# Patient Record
Sex: Female | Born: 1960 | Race: Black or African American | Hispanic: No | Marital: Married
Health system: Western US, Academic
[De-identification: ages and names within clinical notes are randomized; demographics above are authoritative.]

## PROBLEM LIST (undated history)

## (undated) DIAGNOSIS — K8020 Calculus of gallbladder without cholecystitis without obstruction: Secondary | ICD-10-CM

## (undated) DIAGNOSIS — M069 Rheumatoid arthritis, unspecified: Secondary | ICD-10-CM

## (undated) DIAGNOSIS — E039 Hypothyroidism, unspecified: Secondary | ICD-10-CM

## (undated) HISTORY — PX: APPENDECTOMY: SHX54

## (undated) HISTORY — DX: Rheumatoid arthritis, unspecified: M06.9

---

## 2005-05-01 ENCOUNTER — Ambulatory Visit (HOSPITAL_COMMUNITY): Admission: RE | Admit: 2005-05-01 | Discharge: 2005-05-01 | Payer: Self-pay | Admitting: Family Medicine

## 2005-05-20 ENCOUNTER — Emergency Department (HOSPITAL_COMMUNITY): Admission: EM | Admit: 2005-05-20 | Discharge: 2005-05-20 | Payer: Self-pay | Admitting: *Deleted

## 2005-05-22 ENCOUNTER — Other Ambulatory Visit: Admission: RE | Admit: 2005-05-22 | Discharge: 2005-05-22 | Payer: Self-pay | Admitting: General Surgery

## 2010-02-21 ENCOUNTER — Ambulatory Visit (HOSPITAL_COMMUNITY): Admission: RE | Admit: 2010-02-21 | Discharge: 2010-02-21 | Payer: Self-pay | Admitting: Family Medicine

## 2010-11-26 ENCOUNTER — Encounter: Payer: Self-pay | Admitting: Family Medicine

## 2016-03-02 DIAGNOSIS — M19041 Primary osteoarthritis, right hand: Secondary | ICD-10-CM | POA: Diagnosis not present

## 2016-03-02 DIAGNOSIS — M79672 Pain in left foot: Secondary | ICD-10-CM | POA: Diagnosis not present

## 2016-03-02 DIAGNOSIS — M2550 Pain in unspecified joint: Secondary | ICD-10-CM | POA: Diagnosis not present

## 2016-03-02 DIAGNOSIS — R300 Dysuria: Secondary | ICD-10-CM | POA: Diagnosis not present

## 2016-03-02 DIAGNOSIS — M25562 Pain in left knee: Secondary | ICD-10-CM | POA: Diagnosis not present

## 2016-03-02 DIAGNOSIS — M25571 Pain in right ankle and joints of right foot: Secondary | ICD-10-CM | POA: Diagnosis not present

## 2016-03-02 DIAGNOSIS — M25561 Pain in right knee: Secondary | ICD-10-CM | POA: Diagnosis not present

## 2016-03-02 DIAGNOSIS — R5381 Other malaise: Secondary | ICD-10-CM | POA: Diagnosis not present

## 2016-03-02 DIAGNOSIS — Z113 Encounter for screening for infections with a predominantly sexual mode of transmission: Secondary | ICD-10-CM | POA: Diagnosis not present

## 2016-03-02 DIAGNOSIS — M19042 Primary osteoarthritis, left hand: Secondary | ICD-10-CM | POA: Diagnosis not present

## 2016-03-02 DIAGNOSIS — M79671 Pain in right foot: Secondary | ICD-10-CM | POA: Diagnosis not present

## 2016-03-05 ENCOUNTER — Other Ambulatory Visit (HOSPITAL_COMMUNITY): Payer: Self-pay | Admitting: Registered Nurse

## 2016-03-05 DIAGNOSIS — Z1231 Encounter for screening mammogram for malignant neoplasm of breast: Secondary | ICD-10-CM

## 2016-03-07 ENCOUNTER — Ambulatory Visit (HOSPITAL_COMMUNITY)
Admission: RE | Admit: 2016-03-07 | Discharge: 2016-03-07 | Disposition: A | Discharge status: Home / Self Care | Payer: BLUE CROSS/BLUE SHIELD | Source: Ambulatory Visit | Attending: Rheumatology | Admitting: Rheumatology

## 2016-03-07 ENCOUNTER — Ambulatory Visit (HOSPITAL_COMMUNITY)
Admission: RE | Admit: 2016-03-07 | Discharge: 2016-03-07 | Disposition: A | Discharge status: Home / Self Care | Payer: BLUE CROSS/BLUE SHIELD | Source: Ambulatory Visit | Attending: Registered Nurse | Admitting: Registered Nurse

## 2016-03-07 ENCOUNTER — Other Ambulatory Visit (HOSPITAL_COMMUNITY): Payer: Self-pay | Admitting: Rheumatology

## 2016-03-07 DIAGNOSIS — T451X5A Adverse effect of antineoplastic and immunosuppressive drugs, initial encounter: Secondary | ICD-10-CM | POA: Diagnosis not present

## 2016-03-07 DIAGNOSIS — Z139 Encounter for screening, unspecified: Secondary | ICD-10-CM | POA: Diagnosis not present

## 2016-03-07 DIAGNOSIS — Z1231 Encounter for screening mammogram for malignant neoplasm of breast: Secondary | ICD-10-CM | POA: Diagnosis not present

## 2016-03-07 DIAGNOSIS — M069 Rheumatoid arthritis, unspecified: Secondary | ICD-10-CM | POA: Diagnosis not present

## 2016-03-09 ENCOUNTER — Other Ambulatory Visit: Payer: Self-pay | Admitting: Registered Nurse

## 2016-03-09 DIAGNOSIS — R928 Other abnormal and inconclusive findings on diagnostic imaging of breast: Secondary | ICD-10-CM

## 2016-03-13 ENCOUNTER — Ambulatory Visit (HOSPITAL_COMMUNITY)
Admission: RE | Admit: 2016-03-13 | Discharge: 2016-03-13 | Disposition: A | Discharge status: Home / Self Care | Payer: BLUE CROSS/BLUE SHIELD | Source: Ambulatory Visit | Attending: Registered Nurse | Admitting: Registered Nurse

## 2016-03-13 ENCOUNTER — Other Ambulatory Visit: Payer: Self-pay | Admitting: Registered Nurse

## 2016-03-13 DIAGNOSIS — R928 Other abnormal and inconclusive findings on diagnostic imaging of breast: Secondary | ICD-10-CM

## 2016-03-13 DIAGNOSIS — R921 Mammographic calcification found on diagnostic imaging of breast: Secondary | ICD-10-CM | POA: Insufficient documentation

## 2016-03-13 DIAGNOSIS — N6489 Other specified disorders of breast: Secondary | ICD-10-CM | POA: Diagnosis not present

## 2016-03-14 ENCOUNTER — Other Ambulatory Visit: Payer: BLUE CROSS/BLUE SHIELD

## 2016-03-19 DIAGNOSIS — F329 Major depressive disorder, single episode, unspecified: Secondary | ICD-10-CM | POA: Diagnosis not present

## 2016-03-19 DIAGNOSIS — M130 Polyarthritis, unspecified: Secondary | ICD-10-CM | POA: Diagnosis not present

## 2016-03-19 DIAGNOSIS — Z1389 Encounter for screening for other disorder: Secondary | ICD-10-CM | POA: Diagnosis not present

## 2016-04-03 DIAGNOSIS — Z09 Encounter for follow-up examination after completed treatment for conditions other than malignant neoplasm: Secondary | ICD-10-CM | POA: Diagnosis not present

## 2016-04-03 DIAGNOSIS — M0579 Rheumatoid arthritis with rheumatoid factor of multiple sites without organ or systems involvement: Secondary | ICD-10-CM | POA: Diagnosis not present

## 2016-04-03 DIAGNOSIS — M79671 Pain in right foot: Secondary | ICD-10-CM | POA: Diagnosis not present

## 2016-04-03 DIAGNOSIS — Z716 Tobacco abuse counseling: Secondary | ICD-10-CM | POA: Diagnosis not present

## 2016-04-04 DIAGNOSIS — Z6836 Body mass index (BMI) 36.0-36.9, adult: Secondary | ICD-10-CM | POA: Diagnosis not present

## 2016-04-04 DIAGNOSIS — R350 Frequency of micturition: Secondary | ICD-10-CM | POA: Diagnosis not present

## 2016-04-04 DIAGNOSIS — N342 Other urethritis: Secondary | ICD-10-CM | POA: Diagnosis not present

## 2016-04-27 DIAGNOSIS — Z79899 Other long term (current) drug therapy: Secondary | ICD-10-CM | POA: Diagnosis not present

## 2016-05-11 DIAGNOSIS — Z79899 Other long term (current) drug therapy: Secondary | ICD-10-CM | POA: Diagnosis not present

## 2016-06-01 DIAGNOSIS — Z79899 Other long term (current) drug therapy: Secondary | ICD-10-CM | POA: Diagnosis not present

## 2016-07-05 DIAGNOSIS — E559 Vitamin D deficiency, unspecified: Secondary | ICD-10-CM | POA: Diagnosis not present

## 2016-07-05 DIAGNOSIS — Z09 Encounter for follow-up examination after completed treatment for conditions other than malignant neoplasm: Secondary | ICD-10-CM | POA: Diagnosis not present

## 2016-07-05 DIAGNOSIS — M79671 Pain in right foot: Secondary | ICD-10-CM | POA: Diagnosis not present

## 2016-07-05 DIAGNOSIS — M79641 Pain in right hand: Secondary | ICD-10-CM | POA: Diagnosis not present

## 2016-07-05 DIAGNOSIS — R5381 Other malaise: Secondary | ICD-10-CM | POA: Diagnosis not present

## 2016-07-05 DIAGNOSIS — Z79899 Other long term (current) drug therapy: Secondary | ICD-10-CM | POA: Diagnosis not present

## 2016-07-17 DIAGNOSIS — N342 Other urethritis: Secondary | ICD-10-CM | POA: Diagnosis not present

## 2016-07-17 DIAGNOSIS — Z6835 Body mass index (BMI) 35.0-35.9, adult: Secondary | ICD-10-CM | POA: Diagnosis not present

## 2016-08-21 DIAGNOSIS — Z79899 Other long term (current) drug therapy: Secondary | ICD-10-CM | POA: Diagnosis not present

## 2016-09-05 ENCOUNTER — Ambulatory Visit: Payer: Self-pay | Admitting: Rheumatology

## 2016-10-05 DIAGNOSIS — M0579 Rheumatoid arthritis with rheumatoid factor of multiple sites without organ or systems involvement: Secondary | ICD-10-CM | POA: Insufficient documentation

## 2016-10-05 NOTE — Progress Notes (Signed)
Office Visit Note  Patient: Andrea Ayers             Date of Birth: Feb 03, 1961           MRN: [id number]             PCP: [facility] Referring: [facility] Visit Date: 10/08/2016 Occupation: @GUAROCC @    Subjective:  No chief complaint on file. Follow-up on inflammatory arthritis, high risk prescription, left lumbar back pain  History of Present Illness: BETTYJO LUNDBLAD is a 55 y.o. female  Last seen 07/05/2016 and was requested to return back to our office today. On the last visit we converted her from oral methotrexate injectable methotrexate. Patient states that she is doing better with the injectable methotrexate. She no longer has to use of Zofran since she is not having any symptoms of nausea and vomiting.  She is having very good relief with the medication.  She bent over on this morning when her towel fell on the floor and she had a back muscle spasms since then. She is having trouble bending walking squatting. No fever no urinary tract infection symptoms. No falls no other injuries. This is consistent with muscle spasms of the back.    Activities of Daily Living:  Patient reports morning stiffness for 15 minutes.   Patient Denies nocturnal pain.  Difficulty dressing/grooming: Denies Difficulty climbing stairs: Denies Difficulty getting out of chair: Denies Difficulty using hands for taps, buttons, cutlery, and/or writing: Denies   Review of Systems  Constitutional: Negative for fatigue.  HENT: Negative for mouth sores and mouth dryness.   Eyes: Negative for dryness.  Respiratory: Negative for shortness of breath.   Gastrointestinal: Negative for constipation and diarrhea.  Musculoskeletal: Negative for myalgias and myalgias.  Skin: Negative for sensitivity to sunlight.  Psychiatric/Behavioral: Negative for decreased concentration and sleep disturbance.    PMFS History:  Patient Active Problem List   Diagnosis Date  Noted  . Rheumatoid arthritis with rheumatoid factor of multiple sites without organ or systems involvement (HCC) 10/05/2016    Past Medical History:  Diagnosis Date  . Rheumatoid arthritis (HCC)     Family History  Problem Relation Age of Onset  . Rheum arthritis Mother   . Thyroid disease Father   . Fibromyalgia Sister   . Asthma Brother    History reviewed. No pertinent surgical history. Social History   Social History Narrative  . No narrative on file     Objective: Vital Signs: BP (!) 146/91 (BP Location: Right Arm, Patient Position: Sitting, Cuff Size: Large)   Pulse 86   Resp 14   Ht 5\' 6"  (1.676 m)   Wt 210 lb (95.3 kg)   BMI 33.89 kg/m    Physical Exam  Constitutional: She is oriented to person, place, and time. She appears well-developed and well-nourished.  HENT:  Head: Normocephalic and atraumatic.  Eyes: EOM are normal. Pupils are equal, round, and reactive to light.  Cardiovascular: Normal rate, regular rhythm and normal heart sounds.  Exam reveals no gallop and no friction rub.   No murmur heard. Pulmonary/Chest: Effort normal and breath sounds normal. She has no wheezes. She has no rales.  Abdominal: Soft. Bowel sounds are normal. She exhibits no distension. There is no tenderness. There is no guarding. No hernia.  Musculoskeletal: Normal range of motion. She exhibits no edema, tenderness or deformity.  Lymphadenopathy:    She has no cervical adenopathy.  Neurological: She is alert  and oriented to person, place, and time. Coordination normal.  Skin: Skin is warm and dry. Capillary refill takes less than 2 seconds. No rash noted.  Psychiatric: She has a normal mood and affect. Her behavior is normal.     Musculoskeletal Exam:  Full range of motion of all joints except patient has trouble doing range of motion exercises due to her back muscle spasms in the left lumbar back. Fibromyalgia tender points are all absent Grip strength is equal and strong  bilaterally  CDAI Exam: CDAI Homunculus Exam:   Joint Counts:  CDAI Tender Joint count: 0 CDAI Swollen Joint count: 0    No synovitis on examination  Investigation: Findings:   Labs from 10/172017 shows CMP with GFR is normal.  CBC with differential is normal. February 2017:  Rheumatoid factor was 49.1.  Sed rate 22.  ANA was 1:160.  Uric acid 5.5.  TSH was elevated with 7.25.    X-ray of bilateral hands, 2 views today, showed bilateral DIP narrowing, 1st DIP has severe narrowing.  There were no MCP changes or erosive changes.  Bilateral knee joint x-rays showed bilateral moderate medial compartment narrowing, bilateral moderate patellofemoral narrowing consistent with moderate osteoarthritis and mild to moderate chondromalacia patella.   Bilateral feet x-ray showed bilateral small calcaneal spur and right tarsal erosion consistent with inflammatory arthritis.  Right ankle joint x-ray was unremarkable.       Imaging: No results found.  Speciality Comments: No specialty comments available.    Procedures:  No procedures performed Allergies: Patient has no known allergies.   Assessment / Plan:     Visit Diagnoses: Rheumatoid arthritis with rheumatoid factor of multiple sites without organ or systems involvement (HCC)  High risk medications (not anticoagulants) long-term use - Plan: CBC with Differential/Platelet, Comprehensive metabolic panel, CBC with Differential/Platelet, Comprehensive metabolic panel  Muscle spasm of back - left back onset today after bending over to pick up towel from floor (10/08/2016);  Primary osteoarthritis of both hands  Abnormal mammogram - Plan: US BREAST LTD UNI LEFT INC AXILLA   Prescription: Prednisone 5 mg, 4 pills every morning for 3 days, 3 pills every morning for 3 days, 2 pills every morning for 3 days, 1 pill every morning for 3 days, stop. Start the medication tomorrow morning.  Robaxin 500 mg: 1 up to 3 times a day when necessary  muscle spasms; 30 day supply with no refills Use when necessary. Stop the medication as soon as her back starts feeling better  Continue methotrexate injectable at 0.8 ML's every week Folic acid 2 mg every day  Out of work Monday, Tuesday, Wednesday; return to work 10/11/2016. This is because patient is having back muscle spasms and she is unable to move without pain and difficulty. I believe the prednisone and Robaxin that I'm giving her should help her recover in the next few days. She may need a few extra days to be out of work.  She is asking for FMLA to be filled out am unable to give her FMLA for her back muscle spasms. I will give her a work note to be out. She would benefit from her PCP doing FMLA.  Return to clinic in 5 months  Orders: Orders Placed This Encounter  Procedures  . CBC with Differential/Platelet  . Comprehensive metabolic panel   Meds ordered this encounter  Medications  . methotrexate 50 MG/2ML injection    Sig: Inject 0.8 mLs (20 mg total) into the skin once a week.  Dispense:  10 mL    Refill:  0    mtx with preservatives; 32ml bottles for 90 day supply;    Order Specific Question:   Supervising Provider    Answer:   Pollyann Savoy [[id number]]  . folic acid (FOLVITE) 1 MG tablet    Sig: Take 2 tablets (2 mg total) by mouth daily.    Dispense:  180 tablet    Refill:  4    Order Specific Question:   Supervising Provider    Answer:   Pollyann Savoy [phone]    Face-to-face time spent with patient was 30 minutes. 50% of time was spent in counseling and coordination of care.  Follow-Up Instructions: Return in about 5 months (around 03/08/2017) for Inflam Arth, Mtx 0.28ml, left back muscle spasm, oa hands.   Tawni Pummel, PA-C   I examined and evaluated the patient with Tawni Pummel PA. The plan of care was discussed as noted above.  Pollyann Savoy, MD

## 2016-10-08 ENCOUNTER — Ambulatory Visit (INDEPENDENT_AMBULATORY_CARE_PROVIDER_SITE_OTHER): Payer: BLUE CROSS/BLUE SHIELD | Admitting: Rheumatology

## 2016-10-08 ENCOUNTER — Encounter: Payer: Self-pay | Admitting: Rheumatology

## 2016-10-08 VITALS — BP 146/91 | HR 86 | Resp 14 | Ht 66.0 in | Wt 210.0 lb

## 2016-10-08 DIAGNOSIS — M19041 Primary osteoarthritis, right hand: Secondary | ICD-10-CM | POA: Diagnosis not present

## 2016-10-08 DIAGNOSIS — R928 Other abnormal and inconclusive findings on diagnostic imaging of breast: Secondary | ICD-10-CM

## 2016-10-08 DIAGNOSIS — M62830 Muscle spasm of back: Secondary | ICD-10-CM

## 2016-10-08 DIAGNOSIS — M19042 Primary osteoarthritis, left hand: Secondary | ICD-10-CM

## 2016-10-08 DIAGNOSIS — M0579 Rheumatoid arthritis with rheumatoid factor of multiple sites without organ or systems involvement: Secondary | ICD-10-CM | POA: Diagnosis not present

## 2016-10-08 DIAGNOSIS — Z79899 Other long term (current) drug therapy: Secondary | ICD-10-CM

## 2016-10-08 LAB — CBC WITH DIFFERENTIAL/PLATELET
BASOS ABS: 0 {cells}/uL (ref 0–200)
Basophils Relative: 0 %
EOS ABS: 273 {cells}/uL (ref 15–500)
Eosinophils Relative: 3 %
HCT: 40.2 % (ref 35.0–45.0)
Hemoglobin: 13.2 g/dL (ref 11.7–15.5)
LYMPHS PCT: 28 %
Lymphs Abs: 2548 cells/uL (ref 850–3900)
MCH: 33 pg (ref 27.0–33.0)
MCHC: 32.8 g/dL (ref 32.0–36.0)
MCV: 100.5 fL — AB (ref 80.0–100.0)
MONOS PCT: 2 %
MPV: 9.6 fL (ref 7.5–12.5)
Monocytes Absolute: 182 cells/uL — ABNORMAL LOW (ref 200–950)
NEUTROS ABS: 6097 {cells}/uL (ref 1500–7800)
NEUTROS PCT: 67 %
PLATELETS: 296 10*3/uL (ref 140–400)
RBC: 4 MIL/uL (ref 3.80–5.10)
RDW: 14.1 % (ref 11.0–15.0)
WBC: 9.1 10*3/uL (ref 3.8–10.8)

## 2016-10-08 LAB — COMPREHENSIVE METABOLIC PANEL
ALK PHOS: 84 U/L (ref 33–130)
ALT: 16 U/L (ref 6–29)
AST: 22 U/L (ref 10–35)
Albumin: 4.1 g/dL (ref 3.6–5.1)
BILIRUBIN TOTAL: 0.6 mg/dL (ref 0.2–1.2)
BUN: 10 mg/dL (ref 7–25)
CHLORIDE: 103 mmol/L (ref 98–110)
CO2: 28 mmol/L (ref 20–31)
Calcium: 9.5 mg/dL (ref 8.6–10.4)
Creat: 0.85 mg/dL (ref 0.50–1.05)
Glucose, Bld: 90 mg/dL (ref 65–99)
POTASSIUM: 4.3 mmol/L (ref 3.5–5.3)
Sodium: 138 mmol/L (ref 135–146)
TOTAL PROTEIN: 6.7 g/dL (ref 6.1–8.1)

## 2016-10-08 MED ORDER — METHOTREXATE SODIUM CHEMO INJECTION 50 MG/2ML: 20.0000 mg | INTRAMUSCULAR | 0 refills | Status: DC

## 2016-10-08 MED ORDER — PREDNISONE 5 MG PO TABS: ORAL_TABLET | ORAL | 0 refills | Status: DC

## 2016-10-08 MED ORDER — METHOCARBAMOL 500 MG PO TABS: 500.0000 mg | ORAL_TABLET | Freq: Three times a day (TID) | ORAL | 0 refills | Status: DC | PRN

## 2016-10-08 MED ORDER — FOLIC ACID 1 MG PO TABS: 2.0000 mg | ORAL_TABLET | Freq: Every day | ORAL | 4 refills | Status: DC

## 2016-10-09 NOTE — Progress Notes (Signed)
Please notify patient that her CMP with GFR is normal and CBC with differential is within normal limits.No change in treatment

## 2016-11-05 ENCOUNTER — Other Ambulatory Visit: Payer: Self-pay | Admitting: Rheumatology

## 2016-11-06 ENCOUNTER — Telehealth: Payer: Self-pay | Admitting: Rheumatology

## 2016-11-06 DIAGNOSIS — N3000 Acute cystitis without hematuria: Secondary | ICD-10-CM | POA: Diagnosis not present

## 2016-11-06 DIAGNOSIS — M0689 Other specified rheumatoid arthritis, multiple sites: Secondary | ICD-10-CM | POA: Diagnosis not present

## 2016-11-06 DIAGNOSIS — Z1389 Encounter for screening for other disorder: Secondary | ICD-10-CM | POA: Diagnosis not present

## 2016-11-06 DIAGNOSIS — J0181 Other acute recurrent sinusitis: Secondary | ICD-10-CM | POA: Diagnosis not present

## 2016-11-06 DIAGNOSIS — Z6835 Body mass index (BMI) 35.0-35.9, adult: Secondary | ICD-10-CM | POA: Diagnosis not present

## 2016-11-06 NOTE — Telephone Encounter (Signed)
Patient states FMLA paperwork was to be filed in December upon her 10/08/16 visit when she was taken out of work for 3 days. Please call patient about this because she is concerned as to why this wasn't filed when it was supposed to be.

## 2016-11-06 NOTE — Telephone Encounter (Signed)
Last visit and labs 10/08/16 Next visit 03/11/17 Ok to refill per Dr Corliss Skains

## 2016-11-07 NOTE — Telephone Encounter (Signed)
Attempted to reach patient and voicemail not set up yet.

## 2016-11-12 NOTE — Telephone Encounter (Signed)
Attempted to contact the patient and unable to leave a message.  

## 2016-11-15 NOTE — Telephone Encounter (Signed)
FYI:#1: We did not discuss FMLA at the last visit. Except I did tell patient that her RA had improved significantly with our treatment. She initially saw [place] April 2017 and then again May 2017 and her third/last visit was August 2017#2: She rated her pain as 10 on a scale of 0-10 before she started treatment with methotrexate. On August visit, her pain was a 3 on a scale of 0-10. I commented that because she is doing so well, the medication is working well for her.#3: Patient stated her mother has artery and did well with [facility]. So in the future, if she does not respond well to methotrexate, we can consider [facility] Ohara as an option if her insurance is agreeable.

## 2016-11-15 NOTE — Telephone Encounter (Signed)
Patient showed up in the office. Patient has been advised we are unable to fill out her FMLA paperwork. Patient given a copy of her last office note stating that.

## 2016-11-15 NOTE — Telephone Encounter (Signed)
FYI - Patient came into office requesting FMLA paper work needs to be completed. Patient was advised, as per last office note, FMLA paperwork needs to be completed by PCP. I printed a copy of the last office note for patient stating plan. Patient did not agree with decision but left with last office note for employer.

## 2017-01-17 DIAGNOSIS — M0689 Other specified rheumatoid arthritis, multiple sites: Secondary | ICD-10-CM | POA: Diagnosis not present

## 2017-01-17 DIAGNOSIS — G64 Other disorders of peripheral nervous system: Secondary | ICD-10-CM | POA: Diagnosis not present

## 2017-01-17 DIAGNOSIS — Z6834 Body mass index (BMI) 34.0-34.9, adult: Secondary | ICD-10-CM | POA: Diagnosis not present

## 2017-01-17 DIAGNOSIS — M1991 Primary osteoarthritis, unspecified site: Secondary | ICD-10-CM | POA: Diagnosis not present

## 2017-01-17 DIAGNOSIS — G629 Polyneuropathy, unspecified: Secondary | ICD-10-CM | POA: Diagnosis not present

## 2017-01-17 DIAGNOSIS — Z1389 Encounter for screening for other disorder: Secondary | ICD-10-CM | POA: Diagnosis not present

## 2017-01-31 ENCOUNTER — Ambulatory Visit (HOSPITAL_COMMUNITY)
Admission: RE | Admit: 2017-01-31 | Discharge: 2017-01-31 | Disposition: A | Discharge status: Home / Self Care | Payer: BLUE CROSS/BLUE SHIELD | Source: Ambulatory Visit | Attending: Family Medicine | Admitting: Family Medicine

## 2017-01-31 ENCOUNTER — Other Ambulatory Visit (HOSPITAL_COMMUNITY): Payer: Self-pay | Admitting: Family Medicine

## 2017-01-31 DIAGNOSIS — Z1389 Encounter for screening for other disorder: Secondary | ICD-10-CM | POA: Diagnosis not present

## 2017-01-31 DIAGNOSIS — M79605 Pain in left leg: Secondary | ICD-10-CM | POA: Diagnosis not present

## 2017-01-31 DIAGNOSIS — E039 Hypothyroidism, unspecified: Secondary | ICD-10-CM | POA: Diagnosis not present

## 2017-01-31 DIAGNOSIS — M7989 Other specified soft tissue disorders: Secondary | ICD-10-CM | POA: Insufficient documentation

## 2017-01-31 DIAGNOSIS — M79662 Pain in left lower leg: Secondary | ICD-10-CM | POA: Diagnosis not present

## 2017-01-31 DIAGNOSIS — Z6834 Body mass index (BMI) 34.0-34.9, adult: Secondary | ICD-10-CM | POA: Diagnosis not present

## 2017-02-20 DIAGNOSIS — E039 Hypothyroidism, unspecified: Secondary | ICD-10-CM | POA: Diagnosis not present

## 2017-03-11 ENCOUNTER — Ambulatory Visit: Payer: BLUE CROSS/BLUE SHIELD | Admitting: Rheumatology

## 2017-03-19 ENCOUNTER — Ambulatory Visit: Payer: BLUE CROSS/BLUE SHIELD | Admitting: Rheumatology

## 2017-03-22 NOTE — Progress Notes (Deleted)
   Office Visit Note  Patient: Andrea Ayers             Date of Birth: [birth year]           MRN: [id number]             [facility]: [facility] Referring: [facility] Visit Date: 04/04/2017 Occupation: @GUAROCC @    Subjective:  No chief complaint on file.   History of Present Illness: Andrea Ayers is a 56 y.o. female ***   Activities of Daily Living:  Patient reports morning stiffness for *** {minute/hour:19697}.   Patient {ACTIONS;DENIES/REPORTS:21021675::"Denies"} nocturnal pain.  Difficulty dressing/grooming: {ACTIONS;DENIES/REPORTS:21021675::"Denies"} Difficulty climbing stairs: {ACTIONS;DENIES/REPORTS:21021675::"Denies"} Difficulty getting out of chair: {ACTIONS;DENIES/REPORTS:21021675::"Denies"} Difficulty using hands for taps, buttons, cutlery, and/or writing: {ACTIONS;DENIES/REPORTS:21021675::"Denies"}   No Rheumatology ROS completed.   PMFS History:  Patient Active Problem List   Diagnosis Date Noted  . Rheumatoid arthritis with rheumatoid factor of multiple sites without organ or systems involvement (HCC) 10/05/2016    Past Medical History:  Diagnosis Date  . Rheumatoid arthritis (HCC)     Family History  Problem Relation Age of Onset  . Rheum arthritis Mother   . Thyroid disease Father   . Fibromyalgia Sister   . Asthma Brother    No past surgical history on file. Social History   Social History Narrative  . No narrative on file     Objective: Vital Signs: There were no vitals taken for this visit.   Physical Exam   Musculoskeletal Exam: ***  CDAI Exam: No CDAI exam completed.    Investigation: Findings:  February 2017:  Rheumatoid factor was 49.1.  Sed rate 22.  ANA was 1:160.  Uric acid 5.5.  TSH was elevated with 7.25.    03/02/2016   X-ray of bilateral hands, 2 views today, showed bilateral DIP narrowing, 1st DIP has severe narrowing.  There were no MCP changes or erosive changes.  Bilateral knee  joint x-rays showed bilateral moderate medial compartment narrowing, bilateral moderate patellofemoral narrowing consistent with moderate osteoarthritis and mild to moderate chondromalacia patella.   Bilateral feet x-ray showed bilateral small calcaneal spur and right tarsal erosion consistent with inflammatory arthritis.  Right ankle joint x-ray was unremarkable.       Imaging: No results found.  Speciality Comments: No specialty comments available.    Procedures:  No procedures performed Allergies: Patient has no known allergies.   Assessment / Plan:     Visit Diagnoses: Rheumatoid arthritis with rheumatoid factor of multiple sites without organ or systems involvement (HCC) - -CCP   High risk medication use  Plantar fasciitis  ANA positive    Orders: No orders of the defined types were placed in this encounter.  No orders of the defined types were placed in this encounter.   Face-to-face time spent with patient was *** minutes. 50% of time was spent in counseling and coordination of care.  Follow-Up Instructions: No Follow-up on file.   Tucker Steedley, RT  Note - This record has been created using 03/04/2016.  Chart creation errors have been sought, but may not always  have been located. Such creation errors do not reflect on  the standard of medical care.

## 2017-04-03 ENCOUNTER — Other Ambulatory Visit: Payer: Self-pay | Admitting: Rheumatology

## 2017-04-03 DIAGNOSIS — Z79899 Other long term (current) drug therapy: Secondary | ICD-10-CM

## 2017-04-03 MED ORDER — METHOTREXATE SODIUM CHEMO INJECTION 50 MG/2ML: INTRAMUSCULAR | 0 refills | Status: DC

## 2017-04-03 NOTE — Telephone Encounter (Signed)
Patient needs a refill on her MTX. Patient uses [facility] in [place]. Please call patient when rx called in.

## 2017-04-03 NOTE — Telephone Encounter (Signed)
Last Visit: 10/08/16 Next Visit: 05/02/17 Labs: 10/08/16  Patient will update labs in the morning.   Okay to refill MTX?

## 2017-04-04 ENCOUNTER — Ambulatory Visit: Payer: BLUE CROSS/BLUE SHIELD | Admitting: Rheumatology

## 2017-04-05 ENCOUNTER — Other Ambulatory Visit: Payer: Self-pay | Admitting: Rheumatology

## 2017-04-05 DIAGNOSIS — Z79899 Other long term (current) drug therapy: Secondary | ICD-10-CM | POA: Diagnosis not present

## 2017-04-06 LAB — COMPREHENSIVE METABOLIC PANEL
ALT: 10 U/L (ref 6–29)
AST: 13 U/L (ref 10–35)
Albumin: 3.9 g/dL (ref 3.6–5.1)
Alkaline Phosphatase: 83 U/L (ref 33–130)
BILIRUBIN TOTAL: 0.3 mg/dL (ref 0.2–1.2)
BUN: 13 mg/dL (ref 7–25)
CALCIUM: 9.3 mg/dL (ref 8.6–10.4)
CO2: 24 mmol/L (ref 20–31)
CREATININE: 0.95 mg/dL (ref 0.50–1.05)
Chloride: 106 mmol/L (ref 98–110)
GLUCOSE: 83 mg/dL (ref 65–99)
Potassium: 4.6 mmol/L (ref 3.5–5.3)
SODIUM: 140 mmol/L (ref 135–146)
Total Protein: 6.2 g/dL (ref 6.1–8.1)

## 2017-04-06 LAB — CBC WITH DIFFERENTIAL/PLATELET
Basophils Absolute: 87 cells/uL (ref 0–200)
Basophils Relative: 1 %
EOS PCT: 3 %
Eosinophils Absolute: 261 cells/uL (ref 15–500)
HCT: 39 % (ref 35.0–45.0)
Hemoglobin: 12.5 g/dL (ref 11.7–15.5)
LYMPHS PCT: 27 %
Lymphs Abs: 2349 cells/uL (ref 850–3900)
MCH: 32.4 pg (ref 27.0–33.0)
MCHC: 32.1 g/dL (ref 32.0–36.0)
MCV: 101 fL — ABNORMAL HIGH (ref 80.0–100.0)
MPV: 9.2 fL (ref 7.5–12.5)
Monocytes Absolute: 609 cells/uL (ref 200–950)
Monocytes Relative: 7 %
NEUTROS PCT: 62 %
Neutro Abs: 5394 cells/uL (ref 1500–7800)
PLATELETS: 329 10*3/uL (ref 140–400)
RBC: 3.86 MIL/uL (ref 3.80–5.10)
RDW: 14.1 % (ref 11.0–15.0)
WBC: 8.7 10*3/uL (ref 3.8–10.8)

## 2017-04-26 DIAGNOSIS — M19041 Primary osteoarthritis, right hand: Secondary | ICD-10-CM | POA: Insufficient documentation

## 2017-04-26 DIAGNOSIS — M24562 Contracture, left knee: Secondary | ICD-10-CM | POA: Insufficient documentation

## 2017-04-26 DIAGNOSIS — M5030 Other cervical disc degeneration, unspecified cervical region: Secondary | ICD-10-CM | POA: Insufficient documentation

## 2017-04-26 DIAGNOSIS — E559 Vitamin D deficiency, unspecified: Secondary | ICD-10-CM | POA: Insufficient documentation

## 2017-04-26 DIAGNOSIS — M19042 Primary osteoarthritis, left hand: Secondary | ICD-10-CM

## 2017-04-26 DIAGNOSIS — M7732 Calcaneal spur, left foot: Secondary | ICD-10-CM

## 2017-04-26 DIAGNOSIS — M2242 Chondromalacia patellae, left knee: Secondary | ICD-10-CM | POA: Insufficient documentation

## 2017-04-26 DIAGNOSIS — Z8639 Personal history of other endocrine, nutritional and metabolic disease: Secondary | ICD-10-CM | POA: Insufficient documentation

## 2017-04-26 DIAGNOSIS — Z79899 Other long term (current) drug therapy: Secondary | ICD-10-CM | POA: Insufficient documentation

## 2017-04-26 DIAGNOSIS — F17200 Nicotine dependence, unspecified, uncomplicated: Secondary | ICD-10-CM | POA: Insufficient documentation

## 2017-04-26 DIAGNOSIS — M722 Plantar fascial fibromatosis: Secondary | ICD-10-CM | POA: Insufficient documentation

## 2017-04-26 DIAGNOSIS — M7731 Calcaneal spur, right foot: Secondary | ICD-10-CM | POA: Insufficient documentation

## 2017-04-26 DIAGNOSIS — M2241 Chondromalacia patellae, right knee: Secondary | ICD-10-CM | POA: Insufficient documentation

## 2017-04-26 NOTE — Progress Notes (Signed)
Office Visit Note  Patient: Andrea Ayers             Date of Birth: 28-Oct-1961           MRN: [id number]             [facility]: [facility] Referring: [facility] Visit Date: 05/02/2017 Occupation: @GUAROCC @    Subjective:  Medication Management, increased hand pain   History of Present Illness: Andrea Ayers is a 56 y.o. female  Patient was last seen in our office on 10/08/2016 for rheumatoid arthritis.  She was placed on injectable methotrexate sometime in August 2017. By 10/08/2016, patient reported that injectable methotrexate was working really well for her. In fact, the Zofran that we gave her was not actually needed any longer because she was not having any GI side effects with methotrexate injectable on most days. On some days, she does end up needing Zofran but on most a she does not.  Today, patient states she is doing very well with her rheumatoid arthritis and methotrexate. The only time she has minor joint discomfort that lasts for a day or 2 is when she overdoes it with activity. It usually resolves on its own in 2 days. Otherwise she is doing really well with her joints.  She states that she does not miss any doses of methotrexate and she is very consistent in the timing of her methotrexate as well.  pcp tx'd pt w/ gabapentin for  Lower leg pain. Pt vastly improved w/ gabapentin.  Activities of Daily Living:  Patient reports morning stiffness for 30 mins.   Patient denies nocturnal pain.  Difficulty dressing/grooming: Denies Difficulty climbing stairs: denies Difficulty getting out of chair: denies Difficulty using hands for taps, buttons, cutlery, and/or writing: denies   Review of Systems  Constitutional: Negative for fatigue.  HENT: Negative for mouth sores and mouth dryness.   Eyes: Negative for dryness.  Respiratory: Negative for shortness of breath.   Gastrointestinal: Negative for constipation and diarrhea.    Musculoskeletal: Negative for myalgias and myalgias.  Skin: Negative for sensitivity to sunlight.  Psychiatric/Behavioral: Negative for decreased concentration and sleep disturbance.    PMFS History:  Patient Active Problem List   Diagnosis Date Noted  . Primary osteoarthritis of both knees 04/30/2017  . ANA positive 04/30/2017  . High risk medication use 04/26/2017  . Plantar fasciitis 04/26/2017  . Flexion contracture of left knee 04/26/2017  . DDD (degenerative disc disease), cervical 04/26/2017  . Bilateral calcaneal spurs 04/26/2017  . Primary osteoarthritis of both hands 04/26/2017  . History of hypothyroidism 04/26/2017  . Vitamin D deficiency 04/26/2017  . Smoker 04/26/2017  . Chondromalacia of both patellae 04/26/2017  . Rheumatoid arthritis with rheumatoid factor of multiple sites without organ or systems involvement (HCC) 10/05/2016    Past Medical History:  Diagnosis Date  . Rheumatoid arthritis (HCC)     Family History  Problem Relation Age of Onset  . Rheum arthritis Mother   . Thyroid disease Father   . Fibromyalgia Sister   . Asthma Brother    No past surgical history on file. Social History   Social History Narrative  . No narrative on file     Objective: Vital Signs: BP 128/68   Pulse 78   Resp 16   Ht 5\' 4"  (1.626 m)   Wt 194 lb (88 kg)   BMI 33.30 kg/m    Physical Exam  Constitutional: She is oriented to  person, place, and time. She appears well-developed and well-nourished.  HENT:  Head: Normocephalic and atraumatic.  Eyes: EOM are normal. Pupils are equal, round, and reactive to light.  Cardiovascular: Normal rate, regular rhythm and normal heart sounds.  Exam reveals no gallop and no friction rub.   No murmur heard. Pulmonary/Chest: Effort normal and breath sounds normal. She has no wheezes. She has no rales.  Abdominal: Soft. Bowel sounds are normal. She exhibits no distension. There is no tenderness. There is no guarding. No hernia.   Musculoskeletal: Normal range of motion. She exhibits no edema, tenderness or deformity.  Lymphadenopathy:    She has no cervical adenopathy.  Neurological: She is alert and oriented to person, place, and time. Coordination normal.  Skin: Skin is warm and dry. Capillary refill takes less than 2 seconds. No rash noted.  Psychiatric: She has a normal mood and affect. Her behavior is normal.  Nursing note and vitals reviewed.    Musculoskeletal Exam:  Full range of motion of all joints Grip strength is equal and strong bilaterally Fiber myalgia tender points are all absent  CDAI Exam: CDAI Homunculus Exam:   Joint Counts:  CDAI Tender Joint count: 0 CDAI Swollen Joint count: 0  Global Assessments:  Patient Global Assessment: 4 Provider Global Assessment: 4  CDAI Calculated Score: 8  No synovitis on examination  Investigation: Findings:  02/2016 negative TB Gold   CBC Latest Ref Rng & Units 04/05/2017 10/08/2016  WBC 3.8 - 10.8 K/uL 8.7 9.1  Hemoglobin 11.7 - 15.5 g/dL 82.5 00.3  Hematocrit 70.4 - 45.0 % 39.0 40.2  Platelets 140 - 400 K/uL 329 296   CMP Latest Ref Rng & Units 04/05/2017 10/08/2016  Glucose 65 - 99 mg/dL 83 90  BUN 7 - 25 mg/dL 13 10  Creatinine 8.88 - 1.05 mg/dL 9.16 9.45  Sodium 038 - 146 mmol/L 140 138  Potassium 3.5 - 5.3 mmol/L 4.6 4.3  Chloride 98 - 110 mmol/L 106 103  CO2 20 - 31 mmol/L 24 28  Calcium 8.6 - 10.4 mg/dL 9.3 9.5  Total Protein 6.1 - 8.1 g/dL 6.2 6.7  Total Bilirubin 0.2 - 1.2 mg/dL 0.3 0.6  Alkaline Phos 33 - 130 U/L 83 84  AST 10 - 35 U/L 13 22  ALT 6 - 29 U/L 10 16    Imaging: No results found.  Speciality Comments: No specialty comments available.    Procedures:  No procedures performed Allergies: Patient has no known allergies.   Assessment / Plan:     Visit Diagnoses: Rheumatoid arthritis with rheumatoid factor of multiple sites -CCP + ANA   High risk medication use - Methotrexate 0.8 ml sq q week, Folic acid 2 mg  po qd, last prednisone taper was 12/17  Primary osteoarthritis of both hands  Flexion contracture of left knee  Primary osteoarthritis of both knees  Chondromalacia of both patellae  Plantar fasciitis  Bilateral calcaneal spurs  DDD (degenerative disc disease), cervical  History of hypothyroidism  Vitamin D deficiency  Smoker  ANA positive - 1:160 ENA negative    Plan: #1: Rheumatoid arthritis. Well-controlled with injectable methotrexate 0.8 ML's per week. She was on oral methotrexate and did not tolerate it well but patient is getting adequate response with the current dose. Morning stiffness is less than 15 minutes. No joint stiffness, swelling, redness, pain.  #2: High-risk prescription Methotrexate 0.8 ML's per week Folic acid 2 mg per day Labs from 04/05/2017 are within normal limits. She'll  be due for repeat labs in 3 months. I've given her the order.  #3: Smoking cessation Patient has gradually tapered down to about 4 cigarettes per day. Discussed strategies to discontinue smoking before the next office visit.  #4: Return to clinic in 5 months  #5: Patient does not need any medication refill at this time. She is aware to call the pharmacy who then will contact us when she needs refilled.  Orders: No orders of the defined types were placed in this encounter.  No orders of the defined types were placed in this encounter.   Face-to-face time spent with patient was 30 minutes. 50% of time was spent in counseling and coordination of care.  Follow-Up Instructions: Return in about 5 months (around 10/02/2017) for RA,mtx 0.59ml,folic 2mg , smoking cessation, .   , PA-C  Note - This record has been created using Tawni Pummel.  Chart creation errors have been sought, but may not always  have been located. Such creation errors do not reflect on  the standard of medical care.

## 2017-04-30 DIAGNOSIS — R768 Other specified abnormal immunological findings in serum: Secondary | ICD-10-CM | POA: Insufficient documentation

## 2017-04-30 DIAGNOSIS — M170 Bilateral primary osteoarthritis of knee: Secondary | ICD-10-CM | POA: Insufficient documentation

## 2017-05-02 ENCOUNTER — Ambulatory Visit (INDEPENDENT_AMBULATORY_CARE_PROVIDER_SITE_OTHER): Payer: BLUE CROSS/BLUE SHIELD | Admitting: Rheumatology

## 2017-05-02 ENCOUNTER — Encounter: Payer: Self-pay | Admitting: Rheumatology

## 2017-05-02 VITALS — BP 128/68 | HR 78 | Resp 16 | Ht 64.0 in | Wt 194.0 lb

## 2017-05-02 DIAGNOSIS — M7732 Calcaneal spur, left foot: Secondary | ICD-10-CM

## 2017-05-02 DIAGNOSIS — M19041 Primary osteoarthritis, right hand: Secondary | ICD-10-CM

## 2017-05-02 DIAGNOSIS — F17200 Nicotine dependence, unspecified, uncomplicated: Secondary | ICD-10-CM

## 2017-05-02 DIAGNOSIS — M0579 Rheumatoid arthritis with rheumatoid factor of multiple sites without organ or systems involvement: Secondary | ICD-10-CM | POA: Diagnosis not present

## 2017-05-02 DIAGNOSIS — M170 Bilateral primary osteoarthritis of knee: Secondary | ICD-10-CM

## 2017-05-02 DIAGNOSIS — M24562 Contracture, left knee: Secondary | ICD-10-CM | POA: Diagnosis not present

## 2017-05-02 DIAGNOSIS — E559 Vitamin D deficiency, unspecified: Secondary | ICD-10-CM | POA: Diagnosis not present

## 2017-05-02 DIAGNOSIS — M19042 Primary osteoarthritis, left hand: Secondary | ICD-10-CM

## 2017-05-02 DIAGNOSIS — R768 Other specified abnormal immunological findings in serum: Secondary | ICD-10-CM

## 2017-05-02 DIAGNOSIS — Z79899 Other long term (current) drug therapy: Secondary | ICD-10-CM

## 2017-05-02 DIAGNOSIS — M7731 Calcaneal spur, right foot: Secondary | ICD-10-CM

## 2017-05-02 DIAGNOSIS — M2241 Chondromalacia patellae, right knee: Secondary | ICD-10-CM

## 2017-05-02 DIAGNOSIS — M2242 Chondromalacia patellae, left knee: Secondary | ICD-10-CM

## 2017-05-02 DIAGNOSIS — M722 Plantar fascial fibromatosis: Secondary | ICD-10-CM

## 2017-05-02 DIAGNOSIS — Z8639 Personal history of other endocrine, nutritional and metabolic disease: Secondary | ICD-10-CM

## 2017-05-02 DIAGNOSIS — M5030 Other cervical disc degeneration, unspecified cervical region: Secondary | ICD-10-CM

## 2017-07-02 LAB — WBC AUTO DIFF (OUTSIDE ORGANIZATION)
Basophils % Auto: 0 % — NL (ref 0–1)
Eosinophils % Auto: 2 % — NL (ref 0–7)
Immature Granulocytes % Auto: 0 % — NL (ref 0–1)
Lymphocytes % Auto: 36 % — NL (ref 15–47)
Monocytes % Auto: 7 % — NL (ref 5–13)
Neutrophils % Auto: 55 % — NL (ref 42–76)
Neutrophils, Automated Count: 4 10*3/uL — NL (ref 1.8–7.9)

## 2017-07-02 LAB — POTASSIUM: Potassium: 4.6 meq/L — NL (ref 3.5–5.3)

## 2017-07-02 LAB — IRON AND TOTAL IRON BINDING CAPACITY (OUTSIDE ORGANIZATION)
Iron Percent Saturation: 20 % — NL (ref 14–57)
Iron Total: 61 ug/dL — NL (ref 50–212)
Iron binding capacity, unsaturated: 237 ug/dL — NL (ref 110–370)
Total Iron Binding Capacity: 298 ug/dL — NL (ref 228–428)

## 2017-07-02 LAB — CBC NO DIFFERENTIAL
Hematocrit: 34.2 % — NL (ref 34.0–46.0)
Hemoglobin: 11.2 g/dL — ABNORMAL LOW (ref 11.5–15.0)
MCV: 91 fL — NL (ref 80–100)
Platelets count: 241 10*3/uL — NL (ref 140–400)
RDW, RBC: 12.2 % — NL (ref 12.0–16.5)
Red blood cells count: 3.75 M/uL — NL (ref 3.60–5.70)
WBC Count: 7.3 10*3/uL — NL (ref 3.7–11.1)

## 2017-07-02 LAB — THYROID STIMULATING HORMONE: Thyroid Stimulating Hormone: 2.32 u[IU]/mL — NL (ref 0.40–4.50)

## 2017-07-02 LAB — SODIUM: Sodium: 138 meq/L — NL (ref 135–145)

## 2017-07-02 LAB — FERRITIN: Ferritin: 309 ng/mL — ABNORMAL HIGH (ref 22–291)

## 2017-07-05 ENCOUNTER — Telehealth: Payer: Self-pay | Admitting: Rheumatology

## 2017-07-05 MED ORDER — METHOTREXATE SODIUM CHEMO INJECTION 50 MG/2ML: INTRAMUSCULAR | 0 refills | Status: DC

## 2017-07-05 NOTE — Telephone Encounter (Signed)
Last Visit: 05/02/17 Next Visit: 10/08/17 Labs: 04/05/17 WNL  Okay to refill per Dr. Corliss Skains

## 2017-07-05 NOTE — Telephone Encounter (Signed)
Patient left a message requesting refill on MTX. Please call to advise.

## 2017-07-24 ENCOUNTER — Telehealth: Payer: Self-pay | Admitting: Rheumatology

## 2017-07-24 MED ORDER — "TUBERCULIN-ALLERGY SYRINGES 27G X 1/2"" 1 ML MISC": 3 refills | Status: DC

## 2017-07-24 NOTE — Telephone Encounter (Signed)
Patient calling requesting rx for syringes only for MTX. Patient uses [facility] in [place].

## 2017-07-24 NOTE — Telephone Encounter (Signed)
Last Visit: 05/02/17 Next Visit: 10/08/17  Okay to refill per Dr. Corliss Skains

## 2017-08-26 DIAGNOSIS — E063 Autoimmune thyroiditis: Secondary | ICD-10-CM | POA: Diagnosis not present

## 2017-08-26 DIAGNOSIS — Z1389 Encounter for screening for other disorder: Secondary | ICD-10-CM | POA: Diagnosis not present

## 2017-08-26 DIAGNOSIS — Z6830 Body mass index (BMI) 30.0-30.9, adult: Secondary | ICD-10-CM | POA: Diagnosis not present

## 2017-08-26 DIAGNOSIS — E6609 Other obesity due to excess calories: Secondary | ICD-10-CM | POA: Diagnosis not present

## 2017-08-26 DIAGNOSIS — M069 Rheumatoid arthritis, unspecified: Secondary | ICD-10-CM | POA: Diagnosis not present

## 2017-09-18 DIAGNOSIS — M069 Rheumatoid arthritis, unspecified: Secondary | ICD-10-CM | POA: Diagnosis not present

## 2017-09-18 DIAGNOSIS — Z1389 Encounter for screening for other disorder: Secondary | ICD-10-CM | POA: Diagnosis not present

## 2017-09-18 DIAGNOSIS — Z23 Encounter for immunization: Secondary | ICD-10-CM | POA: Diagnosis not present

## 2017-09-18 DIAGNOSIS — E063 Autoimmune thyroiditis: Secondary | ICD-10-CM | POA: Diagnosis not present

## 2017-09-18 DIAGNOSIS — M1991 Primary osteoarthritis, unspecified site: Secondary | ICD-10-CM | POA: Diagnosis not present

## 2017-09-18 DIAGNOSIS — Z6830 Body mass index (BMI) 30.0-30.9, adult: Secondary | ICD-10-CM | POA: Diagnosis not present

## 2017-09-18 DIAGNOSIS — F17290 Nicotine dependence, other tobacco product, uncomplicated: Secondary | ICD-10-CM | POA: Diagnosis not present

## 2017-09-19 LAB — CBC NO DIFFERENTIAL
Hematocrit: 35.5 % — NL (ref 34.0–46.0)
Hemoglobin: 11.7 g/dL — NL (ref 11.5–15.0)
MCV: 90 fL — NL (ref 80–100)
Platelets count: 231 10*3/uL — NL (ref 140–400)
RDW, RBC: 11.9 % — ABNORMAL LOW (ref 12.0–16.5)
Red blood cells count: 3.96 M/uL — NL (ref 3.60–5.70)
WBC Count: 6.4 10*3/uL — NL (ref 3.7–11.1)

## 2017-09-19 LAB — MICROALBUMIN

## 2017-09-19 LAB — WBC AUTO DIFF (OUTSIDE ORGANIZATION)
Basophils % Auto: 1 % — NL (ref 0–1)
Eosinophils % Auto: 2 % — NL (ref 0–7)
Immature Granulocytes % Auto: 0 % — NL (ref 0–1)
Lymphocytes % Auto: 33 % — NL (ref 15–47)
Monocytes % Auto: 7 % — NL (ref 5–13)
Neutrophils % Auto: 57 % — NL (ref 42–76)
Neutrophils, Automated Count: 3.7 10*3/uL — NL (ref 1.8–7.9)

## 2017-09-19 LAB — HEMOGLOBIN A1C: Hgb A1C,Glucose Est Avg: 321 mg/dL — ABNORMAL HIGH (ref 85–126)

## 2017-09-19 LAB — POTASSIUM: Potassium: 4.2 meq/L — NL (ref 3.5–5.3)

## 2017-09-25 NOTE — Progress Notes (Deleted)
Office Visit Note  Patient: Andrea Ayers             Date of Birth: [birth year]           MRN: [id number]             PCP: [facility] Referring: [facility] Visit Date: 10/08/2017 Occupation: @GUAROCC @    Subjective:  No chief complaint on file.   History of Present Illness: Andrea Ayers is a 56 y.o. female ***   Activities of Daily Living:  Patient reports morning stiffness for *** {minute/hour:19697}.   Patient {ACTIONS;DENIES/REPORTS:21021675::"Denies"} nocturnal pain.  Difficulty dressing/grooming: {ACTIONS;DENIES/REPORTS:21021675::"Denies"} Difficulty climbing stairs: {ACTIONS;DENIES/REPORTS:21021675::"Denies"} Difficulty getting out of chair: {ACTIONS;DENIES/REPORTS:21021675::"Denies"} Difficulty using hands for taps, buttons, cutlery, and/or writing: {ACTIONS;DENIES/REPORTS:21021675::"Denies"}   No Rheumatology ROS completed.   PMFS History:  Patient Active Problem List   Diagnosis Date Noted  . Primary osteoarthritis of both knees 04/30/2017  . ANA positive 04/30/2017  . High risk medication use 04/26/2017  . Plantar fasciitis 04/26/2017  . Flexion contracture of left knee 04/26/2017  . DDD (degenerative disc disease), cervical 04/26/2017  . Bilateral calcaneal spurs 04/26/2017  . Primary osteoarthritis of both hands 04/26/2017  . History of hypothyroidism 04/26/2017  . Vitamin D deficiency 04/26/2017  . Smoker 04/26/2017  . Chondromalacia of both patellae 04/26/2017  . Rheumatoid arthritis with rheumatoid factor of multiple sites without organ or systems involvement (HCC) 10/05/2016    Past Medical History:  Diagnosis Date  . Rheumatoid arthritis (HCC)     Family History  Problem Relation Age of Onset  . Rheum arthritis Mother   . Thyroid disease Father   . Fibromyalgia Sister   . Asthma Brother    No past surgical history on file. Social History   Social History Narrative  . Not on file      Objective: Vital Signs: There were no vitals taken for this visit.   Physical Exam   Musculoskeletal Exam: ***  CDAI Exam: No CDAI exam completed.    Investigation: No additional findings. CBC Latest Ref Rng & Units 04/05/2017 10/08/2016  WBC 3.8 - 10.8 K/uL 8.7 9.1  Hemoglobin 11.7 - 15.5 g/dL 14/02/2016 81.2  Hematocrit 75.1 - 45.0 % 39.0 40.2  Platelets 140 - 400 K/uL 329 296   CMP Latest Ref Rng & Units 04/05/2017 10/08/2016  Glucose 65 - 99 mg/dL 83 90  BUN 7 - 25 mg/dL 13 10  Creatinine 14/02/2016 - 1.05 mg/dL 1.74 9.44  Sodium 9.67 - 146 mmol/L 140 138  Potassium 3.5 - 5.3 mmol/L 4.6 4.3  Chloride 98 - 110 mmol/L 106 103  CO2 20 - 31 mmol/L 24 28  Calcium 8.6 - 10.4 mg/dL 9.3 9.5  Total Protein 6.1 - 8.1 g/dL 6.2 6.7  Total Bilirubin 0.2 - 1.2 mg/dL 0.3 0.6  Alkaline Phos 33 - 130 U/L 83 84  AST 10 - 35 U/L 13 22  ALT 6 - 29 U/L 10 16    Imaging: No results found.  Speciality Comments: No specialty comments available.    Procedures:  No procedures performed Allergies: Patient has no known allergies.   Assessment / Plan:     Visit Diagnoses: No diagnosis found.    Orders: No orders of the defined types were placed in this encounter.  No orders of the defined types were placed in this encounter.   Face-to-face time spent with patient was *** minutes. 50% of time was spent in counseling and coordination  of care.  Follow-Up Instructions: No Follow-up on file.   Ellen Henri, CMA  Note - This record has been created using Animal nutritionist.  Chart creation errors have been sought, but may not always  have been located. Such creation errors do not reflect on  the standard of medical care.

## 2017-10-08 ENCOUNTER — Ambulatory Visit: Payer: BLUE CROSS/BLUE SHIELD | Admitting: Rheumatology

## 2017-11-07 NOTE — Progress Notes (Signed)
Office Visit Note  Patient: Andrea Ayers             Date of Birth: 11-27-1960           MRN: [id number]             PCP: [facility] Referring: [facility] Visit Date: 11/21/2017 Occupation: @GUAROCC @    Subjective:  Left 5th digit pain and swelling   History of Present Illness: Andrea Ayers is a 57 y.o. female with history of seropositive rheumatoid arthritis, osteoarthritis, and DDD. She continues to take MTX 0.8 ml weekly and folic acid 2 mg.   She feels the MTX has been very effective for her. Patient states she continues to have pain and stiffness in her neck.  She is also having pain and stiffness in her left 5th DIP joint.  she uses her hands a lot at work, and she states that occasionally different joints will cause discomfort.  She states that her bilateral feet pain has improved significantly. She has two new nodules on the left heel. She also has increased ROM of her bilateral ankle joints.  She states that her knee joints cause discomfort occasionally if she is standing for long periods of time.     She reports Gabapentin has been helping with her symptoms of restless legs syndrome.    Activities of Daily Living:  Patient reports morning stiffness for 1.5 hours.   Patient Reports nocturnal pain.  Difficulty dressing/grooming: Denies Difficulty climbing stairs: Denies Difficulty getting out of chair: Denies Difficulty using hands for taps, buttons, cutlery, and/or writing: Denies   Review of Systems  Constitutional: Positive for fatigue. Negative for weakness.  HENT: Negative for mouth sores, mouth dryness and nose dryness.   Eyes: Negative for redness, visual disturbance and dryness.  Respiratory: Negative for cough, hemoptysis, shortness of breath and difficulty breathing.   Cardiovascular: Negative for chest pain, palpitations, hypertension, irregular heartbeat and swelling in legs/feet.  Gastrointestinal: Negative for  blood in stool, constipation and diarrhea.  Endocrine: Negative for increased urination.  Genitourinary: Negative for painful urination.  Musculoskeletal: Positive for arthralgias, joint pain and morning stiffness. Negative for joint swelling, myalgias, muscle weakness, muscle tenderness and myalgias.  Skin: Positive for nodules/bumps. Negative for pallor, rash, hair loss, redness, skin tightness and sensitivity to sunlight.  Neurological: Negative for dizziness, numbness and headaches.  Hematological: Negative for swollen glands.  Psychiatric/Behavioral: Positive for sleep disturbance. Negative for depressed mood. The patient is not nervous/anxious.     PMFS History:  Patient Active Problem List   Diagnosis Date Noted  . Primary osteoarthritis of both knees 04/30/2017  . ANA positive 04/30/2017  . High risk medication use 04/26/2017  . Plantar fasciitis 04/26/2017  . Flexion contracture of left knee 04/26/2017  . DDD (degenerative disc disease), cervical 04/26/2017  . Bilateral calcaneal spurs 04/26/2017  . Primary osteoarthritis of both hands 04/26/2017  . History of hypothyroidism 04/26/2017  . Vitamin D deficiency 04/26/2017  . Smoker 04/26/2017  . Chondromalacia of both patellae 04/26/2017  . Rheumatoid arthritis with rheumatoid factor of multiple sites without organ or systems involvement (HCC) 10/05/2016    Past Medical History:  Diagnosis Date  . Rheumatoid arthritis (HCC)     Family History  Problem Relation Age of Onset  . Rheum arthritis Mother   . Thyroid disease Father   . Fibromyalgia Sister   . Asthma Brother   . Healthy Son    History reviewed. No  pertinent surgical history. Social History   Social History Narrative  . Not on file     Objective: Vital Signs: BP (!) 147/93 (BP Location: Left Arm, Patient Position: Sitting, Cuff Size: Normal)   Pulse 69   Resp 17   Ht 5\' 4"  (1.626 m)   Wt 182 lb (82.6 kg)   BMI 31.24 kg/m    Physical Exam    Constitutional: She is oriented to person, place, and time. She appears well-developed and well-nourished.  HENT:  Head: Normocephalic and atraumatic.  Eyes: Conjunctivae and EOM are normal.  Neck: Normal range of motion.  Cardiovascular: Normal rate, regular rhythm, normal heart sounds and intact distal pulses.  Pulmonary/Chest: Effort normal and breath sounds normal.  Abdominal: Soft. Bowel sounds are normal.  Lymphadenopathy:    She has no cervical adenopathy.  Neurological: She is alert and oriented to person, place, and time.  Skin: Skin is warm and dry. Capillary refill takes less than 2 seconds.  Psychiatric: She has a normal mood and affect. Her behavior is normal.  Nursing note and vitals reviewed.    Musculoskeletal Exam: C-spine limited ROM.  Thoracic and lumbar good ROM.  No midline spinal tenderness.  No SI joint pain.  Shoulder joints and elbow joints good ROM.  Wrist joints slightly limited ROM.  MCPs, PIPs, and DIPs good ROM with no synovitis.  PIP and DIP synovial thickening consistent with osteoarthritis. Mucin cyst on left 3rd and 4th PIP joints.  Left 5th DIP joint tenderness due to osteoarthritis.  Hip joints, knee joints, ankle joints, MTPs, PIPs, and DIPs good ROM with no synovitis.  No knee warm or effusion.  She has two small nodules on her left heel.  No tenderness of MTPs.    CDAI Exam: CDAI Homunculus Exam:   Tenderness:  Left hand: 5th MCP  Joint Counts:  CDAI Tender Joint count: 1 CDAI Swollen Joint count: 0  Global Assessments:  Patient Global Assessment: 5 Provider Global Assessment: 5  CDAI Calculated Score: 11    Investigation: No additional findings. CBC Latest Ref Rng & Units 04/05/2017 10/08/2016  WBC 3.8 - 10.8 K/uL 8.7 9.1  Hemoglobin 11.7 - 15.5 g/dL 10.0 71.2  Hematocrit 19.7 - 45.0 % 39.0 40.2  Platelets 140 - 400 K/uL 329 296   CMP Latest Ref Rng & Units 04/05/2017 10/08/2016  Glucose 65 - 99 mg/dL 83 90  BUN 7 - 25 mg/dL 13 10   Creatinine 5.88 - 1.05 mg/dL 3.25 4.98  Sodium 264 - 146 mmol/L 140 138  Potassium 3.5 - 5.3 mmol/L 4.6 4.3  Chloride 98 - 110 mmol/L 106 103  CO2 20 - 31 mmol/L 24 28  Calcium 8.6 - 10.4 mg/dL 9.3 9.5  Total Protein 6.1 - 8.1 g/dL 6.2 6.7  Total Bilirubin 0.2 - 1.2 mg/dL 0.3 0.6  Alkaline Phos 33 - 130 U/L 83 84  AST 10 - 35 U/L 13 22  ALT 6 - 29 U/L 10 16    Imaging: No results found.  Speciality Comments: No specialty comments available.    Procedures:  No procedures performed Allergies: Patient has no known allergies.   Assessment / Plan:     Visit Diagnoses: Rheumatoid arthritis with rheumatoid factor of multiple sites without organ or systems involvement (HCC) - +RF, +ANA: She has no synovitis on exam.  She has left 5th DIP joint pain and stiffness, which is consistent with osteoarthritis.  She has PIP and DIP synovial thickening consistent with osteoarthritis.  Left 3rd and 4th PIP mucin cysts noted.  She will continue on MTX 0.8 ml weekly and Folic acid 2 mg daily.  She was given refills of both medications.    High risk medication use - MTX, folic acid-  Refills sent.  CBC and CMP were drawn today.  Standing lab orders are in place for every 3 months to monitor for drug toxicity. Plan: CBC with Differential/Platelet, COMPLETE METABOLIC PANEL WITH GFR  Primary osteoarthritis of both hands: PIP and DIP synovial thickening consistent with osteoarthritis.  Her left 5th DIP joint is tender and stiff. A handout of hand exercises was provided to the patient.     Primary osteoarthritis of both knees: No knee crepitus, warmth, or effusion.  She has no discomfort at this time.      Chondromalacia of both patellae  Bilateral calcaneal spurs  DDD (degenerative disc disease), cervical: Chronic pain. She has limited ROM of C-spine especially with flexion and extension.    History of vitamin D deficiency - She has a history of vitamin D deficiency.  A vitamin D level was drawn  today.  She has been taking OTC vitamin D supplements occasionally. Plan: VITAMIN D 25 Hydroxy (Vit-D Deficiency, Fractures)  Smoker  History of hypothyroidism  History of depression    Orders: Orders Placed This Encounter  Procedures  . CBC with Differential/Platelet  . COMPLETE METABOLIC PANEL WITH GFR  . VITAMIN D 25 Hydroxy (Vit-D Deficiency, Fractures)  . CBC with Differential/Platelet  . COMPLETE METABOLIC PANEL WITH GFR   Meds ordered this encounter  Medications  . folic acid (FOLVITE) 1 MG tablet    Sig: Take 2 tablets (2 mg total) by mouth daily.    Dispense:  180 tablet    Refill:  4  . methotrexate 50 MG/2ML injection    Sig: INJECT 0.8 ML (CC) SUBCUTANEOUSLY ONCE WEEKLY    Dispense:  10 mL    Refill:  0    Please consider 90 day supplies to promote better adherence    Face-to-face time spent with patient was 30 minutes. Greater than 50% of time was spent in counseling and coordination of care.  Follow-Up Instructions: Return in about 5 months (around 04/21/2018) for Rheumatoid arthritis.   Pollyann Savoy, MD  Note - This record has been created using Animal nutritionist.  Chart creation errors have been sought, but may not always  have been located. Such creation errors do not reflect on  the standard of medical care.

## 2017-11-21 ENCOUNTER — Ambulatory Visit (INDEPENDENT_AMBULATORY_CARE_PROVIDER_SITE_OTHER): Payer: BLUE CROSS/BLUE SHIELD | Admitting: Rheumatology

## 2017-11-21 ENCOUNTER — Encounter (INDEPENDENT_AMBULATORY_CARE_PROVIDER_SITE_OTHER): Payer: Self-pay

## 2017-11-21 ENCOUNTER — Telehealth: Payer: Self-pay | Admitting: Rheumatology

## 2017-11-21 ENCOUNTER — Encounter: Payer: Self-pay | Admitting: Rheumatology

## 2017-11-21 VITALS — BP 147/93 | HR 69 | Resp 17 | Ht 64.0 in | Wt 182.0 lb

## 2017-11-21 DIAGNOSIS — M7731 Calcaneal spur, right foot: Secondary | ICD-10-CM

## 2017-11-21 DIAGNOSIS — Z8659 Personal history of other mental and behavioral disorders: Secondary | ICD-10-CM | POA: Diagnosis not present

## 2017-11-21 DIAGNOSIS — Z79899 Other long term (current) drug therapy: Secondary | ICD-10-CM | POA: Diagnosis not present

## 2017-11-21 DIAGNOSIS — F17200 Nicotine dependence, unspecified, uncomplicated: Secondary | ICD-10-CM

## 2017-11-21 DIAGNOSIS — M170 Bilateral primary osteoarthritis of knee: Secondary | ICD-10-CM | POA: Diagnosis not present

## 2017-11-21 DIAGNOSIS — M19041 Primary osteoarthritis, right hand: Secondary | ICD-10-CM

## 2017-11-21 DIAGNOSIS — Z8639 Personal history of other endocrine, nutritional and metabolic disease: Secondary | ICD-10-CM | POA: Diagnosis not present

## 2017-11-21 DIAGNOSIS — M0579 Rheumatoid arthritis with rheumatoid factor of multiple sites without organ or systems involvement: Secondary | ICD-10-CM

## 2017-11-21 DIAGNOSIS — M19042 Primary osteoarthritis, left hand: Secondary | ICD-10-CM | POA: Diagnosis not present

## 2017-11-21 DIAGNOSIS — M2241 Chondromalacia patellae, right knee: Secondary | ICD-10-CM | POA: Diagnosis not present

## 2017-11-21 DIAGNOSIS — M7732 Calcaneal spur, left foot: Secondary | ICD-10-CM

## 2017-11-21 DIAGNOSIS — M2242 Chondromalacia patellae, left knee: Secondary | ICD-10-CM | POA: Diagnosis not present

## 2017-11-21 DIAGNOSIS — M5030 Other cervical disc degeneration, unspecified cervical region: Secondary | ICD-10-CM | POA: Diagnosis not present

## 2017-11-21 MED ORDER — FOLIC ACID 1 MG PO TABS: 2.0000 mg | ORAL_TABLET | Freq: Every day | ORAL | 4 refills | Status: DC

## 2017-11-21 MED ORDER — METHOTREXATE SODIUM CHEMO INJECTION 50 MG/2ML: INTRAMUSCULAR | 0 refills | Status: DC

## 2017-11-21 NOTE — Patient Instructions (Addendum)
Hand Exercises Hand exercises can be helpful to almost anyone. These exercises can strengthen the hands, improve flexibility and movement, and increase blood flow to the hands. These results can make work and daily tasks easier. Hand exercises can be especially helpful for people who have joint pain from arthritis or have nerve damage from overuse (carpal tunnel syndrome). These exercises can also help people who have injured a hand. Most of these hand exercises are fairly gentle stretching routines. You can do them often throughout the day. Still, it is a good idea to ask your health care provider which exercises would be best for you. Warming your hands before exercise may help to reduce stiffness. You can do this with gentle massage or by placing your hands in warm water for 15 minutes. Also, make sure you pay attention to your level of hand pain as you begin an exercise routine. Exercises Knuckle Bend Repeat this exercise 5-10 times with each hand. 1. Stand or sit with your arm, hand, and all five fingers pointed straight up. Make sure your wrist is straight. 2. Gently and slowly bend your fingers down and inward until the tips of your fingers are touching the tops of your palm. 3. Hold this position for a few seconds. 4. Extend your fingers out to their original position, all pointing straight up again.  Finger Fan Repeat this exercise 5-10 times with each hand. 1. Hold your arm and hand out in front of you. Keep your wrist straight. 2. Squeeze your hand into a fist. 3. Hold this position for a few seconds. 4. Fan out, or spread apart, your hand and fingers as much as possible, stretching every joint fully.  Tabletop Repeat this exercise 5-10 times with each hand. 1. Stand or sit with your arm, hand, and all five fingers pointed straight up. Make sure your wrist is straight. 2. Gently and slowly bend your fingers at the knuckles where they meet the hand until your hand is making an  upside-down L shape. Your fingers should form a tabletop. 3. Hold this position for a few seconds. 4. Extend your fingers out to their original position, all pointing straight up again.  Making Os Repeat this exercise 5-10 times with each hand. 1. Stand or sit with your arm, hand, and all five fingers pointed straight up. Make sure your wrist is straight. 2. Make an O shape by touching your pointer finger to your thumb. Hold for a few seconds. Then open your hand wide. 3. Repeat this motion with each finger on your hand.  Table Spread Repeat this exercise 5-10 times with each hand. 1. Place your hand on a table with your palm facing down. Make sure your wrist is straight. 2. Spread your fingers out as much as possible. Hold this position for a few seconds. 3. Slide your fingers back together again. Hold for a few seconds.  Ball Grip  Repeat this exercise 10-15 times with each hand. 1. Hold a tennis ball or another soft ball in your hand. 2. While slowly increasing pressure, squeeze the ball as hard as possible. 3. Squeeze as hard as you can for 3-5 seconds. 4. Relax and repeat.  Wrist Curls Repeat this exercise 10-15 times with each hand. 1. Sit in a chair that has armrests. 2. Hold a light weight in your hand, such as a dumbbell that weighs 1-3 pounds (0.5-1.4 kg). Ask your health care provider what weight would be best for you. 3. Rest your hand just over   the end of the chair arm with your palm facing up. 4. Gently pivot your wrist up and down while holding the weight. Do not twist your wrist from side to side.  Contact a health care provider if:  Your hand pain or discomfort gets much worse when you do an exercise.  Your hand pain or discomfort does not improve within 2 hours after you exercise. If you have any of these problems, stop doing these exercises right away. Do not do them again unless your health care provider says that you can. Get help right away if:  You  develop sudden, severe hand pain. If this happens, stop doing these exercises right away. Do not do them again unless your health care provider says that you can. This information is not intended to replace advice given to you by your health care provider. Make sure you discuss any questions you have with your health care provider. Document Released: 10/03/2015 Document Revised: 03/29/2016 Document Reviewed: 05/02/2015 Elsevier Interactive Patient Education  2018 [employer] We placed an order today for your standing lab work.    Please come back and get your standing labs in April and every 3 months  We have open lab Monday through Friday from 8:30-11:30 AM and 1:30-4 PM at the office of Dr. Pollyann Savoy.   The office is located at [address], [place], [place] [address] No appointment is necessary.   Labs are drawn by [facility].  You may receive a bill from [facility] for your lab work. If you have any questions regarding directions or hours of operation,  please call [phone].

## 2017-11-21 NOTE — Telephone Encounter (Signed)
Mardelle Matte from [facility] called stating that he was filling the prescription for Methotrexate and had a question about the syringes.   His CB# [phone]

## 2017-11-22 LAB — CBC WITH DIFFERENTIAL/PLATELET
BASOS ABS: 47 {cells}/uL (ref 0–200)
Basophils Relative: 0.7 %
EOS PCT: 3.3 %
Eosinophils Absolute: 221 cells/uL (ref 15–500)
HEMATOCRIT: 37 % (ref 35.0–45.0)
Hemoglobin: 12.6 g/dL (ref 11.7–15.5)
LYMPHS ABS: 2861 {cells}/uL (ref 850–3900)
MCH: 31.1 pg (ref 27.0–33.0)
MCHC: 34.1 g/dL (ref 32.0–36.0)
MCV: 91.4 fL (ref 80.0–100.0)
MONOS PCT: 3.9 %
MPV: 10 fL (ref 7.5–12.5)
Neutro Abs: 3310 cells/uL (ref 1500–7800)
Neutrophils Relative %: 49.4 %
Platelets: 278 10*3/uL (ref 140–400)
RBC: 4.05 10*6/uL (ref 3.80–5.10)
RDW: 12.2 % (ref 11.0–15.0)
Total Lymphocyte: 42.7 %
WBC mixed population: 261 cells/uL (ref 200–950)
WBC: 6.7 10*3/uL (ref 3.8–10.8)

## 2017-11-22 LAB — COMPLETE METABOLIC PANEL WITH GFR
AG Ratio: 1.7 (calc) (ref 1.0–2.5)
ALKALINE PHOSPHATASE (APISO): 89 U/L (ref 33–130)
ALT: 8 U/L (ref 6–29)
AST: 14 U/L (ref 10–35)
Albumin: 4 g/dL (ref 3.6–5.1)
BUN: 9 mg/dL (ref 7–25)
CO2: 27 mmol/L (ref 20–32)
CREATININE: 0.76 mg/dL (ref 0.50–1.05)
Calcium: 9.2 mg/dL (ref 8.6–10.4)
Chloride: 102 mmol/L (ref 98–110)
GFR, Est African American: 100 mL/min/{1.73_m2} (ref >=60)
GFR, Est Non African American: 86 mL/min/{1.73_m2} (ref >=60)
GLOBULIN: 2.3 g/dL (ref 1.9–3.7)
Glucose, Bld: 88 mg/dL (ref 65–99)
POTASSIUM: 4.2 mmol/L (ref 3.5–5.3)
SODIUM: 137 mmol/L (ref 135–146)
Total Bilirubin: 0.3 mg/dL (ref 0.2–1.2)
Total Protein: 6.3 g/dL (ref 6.1–8.1)

## 2017-11-22 LAB — VITAMIN D 25 HYDROXY (VIT D DEFICIENCY, FRACTURES): VIT D 25 HYDROXY: 39 ng/mL (ref 30–100)

## 2017-11-22 NOTE — Progress Notes (Signed)
All labs are WNL

## 2017-11-27 NOTE — Telephone Encounter (Signed)
Spoke with [employer] and advised patient had refills for syringes at [facility] in [place]. They will call to transfer prescription.

## 2018-05-14 NOTE — Progress Notes (Deleted)
Office Visit Note  Patient: Andrea Ayers             Date of Birth: [birth year]           MRN: [id number]             PCP: [facility] Referring: [facility] Visit Date: 05/28/2018 Occupation: @GUAROCC @  Subjective:  No chief complaint on file.   History of Present Illness: Andrea Ayers is a 57 y.o. female ***   Activities of Daily Living:  Patient reports morning stiffness for *** {minute/hour:19697}.   Patient {ACTIONS;DENIES/REPORTS:21021675::"Denies"} nocturnal pain.  Difficulty dressing/grooming: {ACTIONS;DENIES/REPORTS:21021675::"Denies"} Difficulty climbing stairs: {ACTIONS;DENIES/REPORTS:21021675::"Denies"} Difficulty getting out of chair: {ACTIONS;DENIES/REPORTS:21021675::"Denies"} Difficulty using hands for taps, buttons, cutlery, and/or writing: {ACTIONS;DENIES/REPORTS:21021675::"Denies"}  No Rheumatology ROS completed.   PMFS History:  Patient Active Problem List   Diagnosis Date Noted  . Primary osteoarthritis of both knees 04/30/2017  . ANA positive 04/30/2017  . High risk medication use 04/26/2017  . Plantar fasciitis 04/26/2017  . Flexion contracture of left knee 04/26/2017  . DDD (degenerative disc disease), cervical 04/26/2017  . Bilateral calcaneal spurs 04/26/2017  . Primary osteoarthritis of both hands 04/26/2017  . History of hypothyroidism 04/26/2017  . Vitamin D deficiency 04/26/2017  . Smoker 04/26/2017  . Chondromalacia of both patellae 04/26/2017  . Rheumatoid arthritis with rheumatoid factor of multiple sites without organ or systems involvement (HCC) 10/05/2016    Past Medical History:  Diagnosis Date  . Rheumatoid arthritis (HCC)     Family History  Problem Relation Age of Onset  . Rheum arthritis Mother   . Thyroid disease Father   . Fibromyalgia Sister   . Asthma Brother   . Healthy Son    No past surgical history on file. Social History   Social History Narrative  . Not on file     Objective: Vital Signs: There were no vitals taken for this visit.   Physical Exam   Musculoskeletal Exam: ***  CDAI Exam: No CDAI exam completed.   Investigation: No additional findings. CBC Latest Ref Rng & Units 11/21/2017 04/05/2017 10/08/2016  WBC 3.8 - 10.8 Thousand/uL 6.7 8.7 9.1  Hemoglobin 11.7 - 15.5 g/dL 14/02/2016 74.2 59.5  Hematocrit 35.0 - 45.0 % 37.0 39.0 40.2  Platelets 140 - 400 Thousand/uL 278 329 296   CMP Latest Ref Rng & Units 11/21/2017 04/05/2017 10/08/2016  Glucose 65 - 99 mg/dL 88 83 90  BUN 7 - 25 mg/dL 9 13 10   Creatinine 0.50 - 1.05 mg/dL 14/02/2016 7.56  Sodium 135 - 146 mmol/L 137 140 138  Potassium 3.5 - 5.3 mmol/L 4.2 4.6 4.3  Chloride 98 - 110 mmol/L 102 106 103  CO2 20 - 32 mmol/L 27 24 28   Calcium 8.6 - 10.4 mg/dL 9.2 9.3 9.5  Total Protein 6.1 - 8.1 g/dL 6.3 6.2 6.7  Total Bilirubin 0.2 - 1.2 mg/dL 0.3 0.3 0.6  Alkaline Phos 33 - 130 U/L - 83 84  AST 10 - 35 U/L 14 13 22   ALT 6 - 29 U/L 8 10 16    Imaging: No results found.  Recent Labs: Lab Results  Component Value Date   WBC 6.7 11/21/2017   HGB 12.6 11/21/2017   PLT 278 11/21/2017   NA 137 11/21/2017   K 4.2 11/21/2017   CL 102 11/21/2017   CO2 27 11/21/2017   GLUCOSE 88 11/21/2017   BUN 9 11/21/2017   CREATININE 0.76 11/21/2017   BILITOT 0.3  11/21/2017   ALKPHOS 83 04/05/2017   AST 14 11/21/2017   ALT 8 11/21/2017   PROT 6.3 11/21/2017   ALBUMIN 3.9 04/05/2017   CALCIUM 9.2 11/21/2017   GFRAA 100 11/21/2017    Speciality Comments: No specialty comments available.  Procedures:  No procedures performed Allergies: Patient has no known allergies.   Assessment / Plan:     Visit Diagnoses: No diagnosis found.   Orders: No orders of the defined types were placed in this encounter.  No orders of the defined types were placed in this encounter.   Face-to-face time spent with patient was *** minutes. Greater than 50% of time was spent in counseling and coordination of  care.  Follow-Up Instructions: No follow-ups on file.   Ellen Henri, CMA  Note - This record has been created using Animal nutritionist.  Chart creation errors have been sought, but may not always  have been located. Such creation errors do not reflect on  the standard of medical care.

## 2018-05-28 ENCOUNTER — Ambulatory Visit: Payer: BLUE CROSS/BLUE SHIELD | Admitting: Rheumatology

## 2018-07-04 DIAGNOSIS — Z6830 Body mass index (BMI) 30.0-30.9, adult: Secondary | ICD-10-CM | POA: Diagnosis not present

## 2018-07-04 DIAGNOSIS — G609 Hereditary and idiopathic neuropathy, unspecified: Secondary | ICD-10-CM | POA: Diagnosis not present

## 2018-07-04 DIAGNOSIS — Z1389 Encounter for screening for other disorder: Secondary | ICD-10-CM | POA: Diagnosis not present

## 2018-07-04 DIAGNOSIS — E6609 Other obesity due to excess calories: Secondary | ICD-10-CM | POA: Diagnosis not present

## 2018-07-04 DIAGNOSIS — Z23 Encounter for immunization: Secondary | ICD-10-CM | POA: Diagnosis not present

## 2018-07-04 DIAGNOSIS — E039 Hypothyroidism, unspecified: Secondary | ICD-10-CM | POA: Diagnosis not present

## 2018-07-04 DIAGNOSIS — M069 Rheumatoid arthritis, unspecified: Secondary | ICD-10-CM | POA: Diagnosis not present

## 2018-07-08 DIAGNOSIS — Z6829 Body mass index (BMI) 29.0-29.9, adult: Secondary | ICD-10-CM | POA: Diagnosis not present

## 2018-07-08 DIAGNOSIS — J029 Acute pharyngitis, unspecified: Secondary | ICD-10-CM | POA: Diagnosis not present

## 2018-07-08 DIAGNOSIS — J019 Acute sinusitis, unspecified: Secondary | ICD-10-CM | POA: Diagnosis not present

## 2018-07-08 DIAGNOSIS — E663 Overweight: Secondary | ICD-10-CM | POA: Diagnosis not present

## 2018-07-17 NOTE — Progress Notes (Signed)
Office Visit Note  Patient: Andrea Ayers             Date of Birth: 18-Dec-1960           MRN: [id number]             PCP: [facility] Referring: [facility] Visit Date: 07/31/2018 Occupation: @GUAROCC @  Subjective:  Right plantar fasciitis   History of Present Illness: Andrea Ayers is a 57 y.o. female with history of seropositive rheumatoid arthritis, osteoarthritis, and DDD.  She has been injecting MTX 0.8 ml sq every other week and folic acid 2 mg po daily.  She states she experiences fatigue after taking MTX so she has started spacing out the dosing to every other week.  She states she was diagnosed and treatment for strep at the beginning of September.  She states while she was sick she had a minor RA flare.  She denies any other flares recently.  She reports she has been doing very well lately, and she denies any joint pain or joint swelling at this time.  She denies any joint stiffness.  She states she is having right plantar fasciitis.  She states she stretches and rolls it on a regular basis.  She states the pain is worse if she works long shifts.  She states she has had increased energy lately and has been losing weight.    Activities of Daily Living:  Patient reports morning stiffness for 0  minutes.   Patient Denies nocturnal pain.  Difficulty dressing/grooming: Denies Difficulty climbing stairs: Denies Difficulty getting out of chair: Denies Difficulty using hands for taps, buttons, cutlery, and/or writing: Denies  Review of Systems  Constitutional: Negative for fatigue.  HENT: Negative for mouth sores, mouth dryness and nose dryness.   Eyes: Negative for pain, visual disturbance and dryness.  Respiratory: Negative for cough, hemoptysis, shortness of breath and difficulty breathing.   Cardiovascular: Negative for chest pain, palpitations, hypertension and swelling in legs/feet.  Gastrointestinal: Negative for blood in stool,  constipation and diarrhea.  Endocrine: Negative for increased urination.  Genitourinary: Negative for painful urination.  Musculoskeletal: Negative for arthralgias, joint pain, joint swelling, myalgias, muscle weakness, morning stiffness, muscle tenderness and myalgias.  Skin: Negative for color change, pallor, rash, hair loss, nodules/bumps, skin tightness, ulcers and sensitivity to sunlight.  Allergic/Immunologic: Negative for susceptible to infections.  Neurological: Negative for dizziness, numbness, headaches and weakness.  Hematological: Negative for swollen glands.  Psychiatric/Behavioral: Negative for depressed mood and sleep disturbance. The patient is not nervous/anxious.     PMFS History:  Patient Active Problem List   Diagnosis Date Noted  . Primary osteoarthritis of both knees 04/30/2017  . ANA positive 04/30/2017  . High risk medication use 04/26/2017  . Plantar fasciitis 04/26/2017  . Flexion contracture of left knee 04/26/2017  . DDD (degenerative disc disease), cervical 04/26/2017  . Bilateral calcaneal spurs 04/26/2017  . Primary osteoarthritis of both hands 04/26/2017  . History of hypothyroidism 04/26/2017  . Vitamin D deficiency 04/26/2017  . Smoker 04/26/2017  . Chondromalacia of both patellae 04/26/2017  . Rheumatoid arthritis with rheumatoid factor of multiple sites without organ or systems involvement (HCC) 10/05/2016    Past Medical History:  Diagnosis Date  . Rheumatoid arthritis (HCC)     Family History  Problem Relation Age of Onset  . Rheum arthritis Mother   . Thyroid disease Father   . Fibromyalgia Sister   . Asthma Brother   .  Healthy Son    History reviewed. No pertinent surgical history. Social History   Social History Narrative  . Not on file    Objective: Vital Signs: BP (!) 152/88 (BP Location: Left Arm, Patient Position: Sitting, Cuff Size: Large)   Pulse 81   Resp 14   Ht 5\' 4"  (1.626 m)   Wt 179 lb 12.8 oz (81.6 kg)   BMI  30.86 kg/m    Physical Exam  Constitutional: She is oriented to person, place, and time. She appears well-developed and well-nourished.  HENT:  Head: Normocephalic and atraumatic.  Eyes: Conjunctivae and EOM are normal.  Neck: Normal range of motion.  Cardiovascular: Normal rate, regular rhythm, normal heart sounds and intact distal pulses.  Pulmonary/Chest: Effort normal and breath sounds normal.  Abdominal: Soft. Bowel sounds are normal.  Lymphadenopathy:    She has no cervical adenopathy.  Neurological: She is alert and oriented to person, place, and time.  Skin: Skin is warm and dry. Capillary refill takes less than 2 seconds.  Psychiatric: She has a normal mood and affect. Her behavior is normal.  Nursing note and vitals reviewed.    Musculoskeletal Exam: C-spine, thoracic spine, and lumbar spine good ROM.  No midline spinal tenderness.  No SI joint tenderness.  Shoulder joints, elbow joints, wrist joints, MCPs, PIPs, DIPs good range of motion.  No tenderness or inflammation of MCP joints.  Left fifth DIP joint is inflamed.  She has PIP and DIP synovial thickening consistent with osteoarthritis of bilateral hands.  Hip joints, knee joints, ankle joints, MTPs, PIPs, DIPs good range of motion no synovitis.  No warmth or effusion of bilateral knee joints.  No tenderness or swelling of ankle joints.  Right plantar fasciitis.  CDAI Exam: CDAI Score: 0.8  Patient Global Assessment: 4 (mm); Provider Global Assessment: 4 (mm) Swollen: 1 ; Tender: 1  Joint Exam      Right  Left  DIP 5     Swollen Tender     Investigation: No additional findings.  Imaging: No results found.  Recent Labs: Lab Results  Component Value Date   WBC 8.5 07/30/2018   HGB 12.6 07/30/2018   PLT 273 07/30/2018   NA 137 07/30/2018   K 4.3 07/30/2018   CL 103 07/30/2018   CO2 27 07/30/2018   GLUCOSE 81 07/30/2018   BUN 12 07/30/2018   CREATININE 0.84 07/30/2018   BILITOT 0.5 07/30/2018   ALKPHOS  83 04/05/2017   AST 15 07/30/2018   ALT 11 07/30/2018   PROT 6.5 07/30/2018   ALBUMIN 3.9 04/05/2017   CALCIUM 9.1 07/30/2018   GFRAA 89 07/30/2018    Speciality Comments: No specialty comments available.  Procedures:  No procedures performed Allergies: Azithromycin   Assessment / Plan:     Visit Diagnoses: Rheumatoid arthritis with rheumatoid factor of multiple sites without organ or systems involvement (HCC) - +RF, +ANA: She has no synovitis on exam.  She is clinically been doing well on methotrexate 0.8 mL subcutaneously every other week and folic acid 2 mg daily.  She has been spacing her methotrexate dose to every other week due to experiencing fatigue following the dose of methotrexate.  She would like to continue on his current treatment regimen.  She was advised to notify 08/01/2018 if she develops increased joint pain or joint swelling and she is will need to resume methotrexate once weekly.  She was diagnosed and treated for strep pharyngitis at the beginning of September and developed a  minor rheumatoid arthritis flare.  She is not expensing any joint pain or joint swelling or joint stiffness at this time.  A refill of methotrexate and syringes were sent to the pharmacy today.  She will follow up in 5 months.     High risk medication use - MTX 0.8 ml every other week and folic acid 2 mg daily.  CBC and CMP within normal limits 07/30/2018.  She will return for lab work in December and every 3 months to monitor for drug toxicity.  Primary osteoarthritis of both hands: She has PIP and DIP synovial thickening consistent with osteoarthritis of bilateral hands.  She has inflammation of the left fifth DIP joint.  Joint protection and muscle strengthening were discussed.  Primary osteoarthritis of both knees: No warmth or effusion noted.  She has good range of motion.  She has no discomfort in any joints at this time.  Chondromalacia of both patellae  Bilateral calcaneal spurs: She has no feet  pain at this time.   DDD (degenerative disc disease), cervical: She has good ROM on exam with no discomfort.  She has no cervical radiculopathy at this time.   Smoker  Plantar fasciitis-Right: She has tenderness and intermittent tightness and discomfort.  She was encouraged to continue to stretch on a regular basis.  She declined a cortisone injection at this time.  She was advised to notify us if she has worsening pain and tightness.   Other medical conditions are listed as follows:   History of hypothyroidism  History of depression  History of vitamin D deficiency  Flexion contracture of left knee   Orders: No orders of the defined types were placed in this encounter.  Meds ordered this encounter  Medications  . methotrexate 50 MG/2ML injection    Sig: Inject 0.8 mLs (20 mg total) into the skin once a week. Please dispense 2 mL vial with preservatives    Dispense:  10 mL    Refill:  0    Please consider 90 day supplies to promote better adherence  . Tuberculin-Allergy Syringes 27G X 1/2" 1 ML MISC    Sig: Patient to use to inject Methotrexate once weekly.    Dispense:  12 each    Refill:  3      Follow-Up Instructions: Return in about 5 months (around 12/31/2018) for Rheumatoid arthritis, Osteoarthritis, DDD.   Gearldine Bienenstock, PA-C  Note - This record has been created using Dragon software.  Chart creation errors have been sought, but may not always  have been located. Such creation errors do not reflect on  the standard of medical care.

## 2018-07-29 ENCOUNTER — Telehealth: Payer: Self-pay | Admitting: Rheumatology

## 2018-07-29 DIAGNOSIS — Z79899 Other long term (current) drug therapy: Secondary | ICD-10-CM

## 2018-07-29 NOTE — Telephone Encounter (Signed)
Patient called requesting her labwork orders be sent to [facility] in [place].   Patient states she will be going tomorrow morning 07/30/18.

## 2018-07-29 NOTE — Telephone Encounter (Signed)
Lab Orders released.  

## 2018-07-30 DIAGNOSIS — Z79899 Other long term (current) drug therapy: Secondary | ICD-10-CM | POA: Diagnosis not present

## 2018-07-30 LAB — COMPLETE METABOLIC PANEL WITH GFR
AG RATIO: 1.7 (calc) (ref 1.0–2.5)
ALBUMIN MSPROF: 4.1 g/dL (ref 3.6–5.1)
ALKALINE PHOSPHATASE (APISO): 98 U/L (ref 33–130)
ALT: 11 U/L (ref 6–29)
AST: 15 U/L (ref 10–35)
BUN: 12 mg/dL (ref 7–25)
CO2: 27 mmol/L (ref 20–32)
CREATININE: 0.84 mg/dL (ref 0.50–1.05)
Calcium: 9.1 mg/dL (ref 8.6–10.4)
Chloride: 103 mmol/L (ref 98–110)
GFR, EST NON AFRICAN AMERICAN: 77 mL/min/{1.73_m2} (ref >=60)
GFR, Est African American: 89 mL/min/{1.73_m2} (ref >=60)
GLOBULIN: 2.4 g/dL (ref 1.9–3.7)
Glucose, Bld: 81 mg/dL (ref 65–139)
POTASSIUM: 4.3 mmol/L (ref 3.5–5.3)
SODIUM: 137 mmol/L (ref 135–146)
Total Bilirubin: 0.5 mg/dL (ref 0.2–1.2)
Total Protein: 6.5 g/dL (ref 6.1–8.1)

## 2018-07-30 LAB — CBC WITH DIFFERENTIAL/PLATELET
BASOS PCT: 0.5 %
Basophils Absolute: 43 cells/uL (ref 0–200)
EOS ABS: 170 {cells}/uL (ref 15–500)
Eosinophils Relative: 2 %
HCT: 36 % (ref 35.0–45.0)
Hemoglobin: 12.6 g/dL (ref 11.7–15.5)
Lymphs Abs: 2788 cells/uL (ref 850–3900)
MCH: 31 pg (ref 27.0–33.0)
MCHC: 35 g/dL (ref 32.0–36.0)
MCV: 88.7 fL (ref 80.0–100.0)
MONOS PCT: 6 %
MPV: 10.2 fL (ref 7.5–12.5)
NEUTROS PCT: 58.7 %
Neutro Abs: 4990 cells/uL (ref 1500–7800)
PLATELETS: 273 10*3/uL (ref 140–400)
RBC: 4.06 10*6/uL (ref 3.80–5.10)
RDW: 12.5 % (ref 11.0–15.0)
TOTAL LYMPHOCYTE: 32.8 %
WBC: 8.5 10*3/uL (ref 3.8–10.8)
WBCMIX: 510 {cells}/uL (ref 200–950)

## 2018-07-30 NOTE — Telephone Encounter (Signed)
Labs are WNL.

## 2018-07-31 ENCOUNTER — Ambulatory Visit (INDEPENDENT_AMBULATORY_CARE_PROVIDER_SITE_OTHER): Payer: BLUE CROSS/BLUE SHIELD | Admitting: Physician Assistant

## 2018-07-31 ENCOUNTER — Encounter: Payer: Self-pay | Admitting: Physician Assistant

## 2018-07-31 VITALS — BP 152/88 | HR 81 | Resp 14 | Ht 64.0 in | Wt 179.8 lb

## 2018-07-31 DIAGNOSIS — Z79899 Other long term (current) drug therapy: Secondary | ICD-10-CM | POA: Diagnosis not present

## 2018-07-31 DIAGNOSIS — M0579 Rheumatoid arthritis with rheumatoid factor of multiple sites without organ or systems involvement: Secondary | ICD-10-CM | POA: Diagnosis not present

## 2018-07-31 DIAGNOSIS — M19041 Primary osteoarthritis, right hand: Secondary | ICD-10-CM | POA: Diagnosis not present

## 2018-07-31 DIAGNOSIS — M19042 Primary osteoarthritis, left hand: Secondary | ICD-10-CM

## 2018-07-31 DIAGNOSIS — M7731 Calcaneal spur, right foot: Secondary | ICD-10-CM

## 2018-07-31 DIAGNOSIS — M24562 Contracture, left knee: Secondary | ICD-10-CM

## 2018-07-31 DIAGNOSIS — M170 Bilateral primary osteoarthritis of knee: Secondary | ICD-10-CM

## 2018-07-31 DIAGNOSIS — M722 Plantar fascial fibromatosis: Secondary | ICD-10-CM

## 2018-07-31 DIAGNOSIS — M5030 Other cervical disc degeneration, unspecified cervical region: Secondary | ICD-10-CM

## 2018-07-31 DIAGNOSIS — M2241 Chondromalacia patellae, right knee: Secondary | ICD-10-CM

## 2018-07-31 DIAGNOSIS — M7732 Calcaneal spur, left foot: Secondary | ICD-10-CM

## 2018-07-31 DIAGNOSIS — Z8659 Personal history of other mental and behavioral disorders: Secondary | ICD-10-CM

## 2018-07-31 DIAGNOSIS — M2242 Chondromalacia patellae, left knee: Secondary | ICD-10-CM

## 2018-07-31 DIAGNOSIS — Z8639 Personal history of other endocrine, nutritional and metabolic disease: Secondary | ICD-10-CM

## 2018-07-31 DIAGNOSIS — F17200 Nicotine dependence, unspecified, uncomplicated: Secondary | ICD-10-CM

## 2018-07-31 MED ORDER — "TUBERCULIN-ALLERGY SYRINGES 27G X 1/2"" 1 ML MISC": 3 refills | Status: AC

## 2018-07-31 MED ORDER — METHOTREXATE SODIUM CHEMO INJECTION 50 MG/2ML: 20.0000 mg | INTRAMUSCULAR | 0 refills | Status: DC

## 2018-07-31 NOTE — Patient Instructions (Signed)
Standing Labs We placed an order today for your standing lab work.    Please come back and get your standing labs in December and every 3 months   We have open lab Monday through Friday from 8:30-11:30 AM and 1:30-4:00 PM  at the office of Dr. Shaili Deveshwar.   You may experience shorter wait times on Monday and Friday afternoons. The office is located at [address], [address], [place], [place] [address] No appointment is necessary.   Labs are drawn by [facility].  You may receive a bill from [facility] for your lab work. If you have any questions regarding directions or hours of operation,  please call [phone].     

## 2018-08-04 NOTE — Progress Notes (Signed)
Office Visit Note  Patient: Andrea Ayers             Date of Birth: June 16, 1961           MRN: [id number]             PCP: [facility] Referring: [facility] Visit Date: 08/05/2018 Occupation: @GUAROCC @  Subjective:  Right foot pain.   History of Present Illness: Andrea Ayers is a 57 y.o. female she of rheumatoid arthritis and osteoarthritis.  She states she went to a fair other day and after that she started having increased pain and discomfort in her right foot.  She describes pain over the right plantar fascia.  Also feeling discomfort in the left Achilles tendon.  Activities of Daily Living:  Patient reports morning stiffness for 0 minutes.   Patient Reports nocturnal pain.  Difficulty dressing/grooming: Denies Difficulty climbing stairs: Reports Difficulty getting out of chair: Denies Difficulty using hands for taps, buttons, cutlery, and/or writing: Denies  Review of Systems  Constitutional: Negative for fatigue.  HENT: Negative for mouth sores, trouble swallowing, trouble swallowing, loss of taste and mouth dryness.   Eyes: Negative for pain and dryness.  Respiratory: Negative for shortness of breath and difficulty breathing.   Cardiovascular: Negative for chest pain and swelling in legs/feet.  Gastrointestinal: Negative for abdominal pain, constipation, diarrhea, nausea and vomiting.  Endocrine: Negative for increased urination.  Genitourinary: Negative for nocturia and pelvic pain.  Musculoskeletal: Positive for arthralgias, joint pain and joint swelling. Negative for morning stiffness.  Skin: Negative for rash and hair loss.  Allergic/Immunologic: Negative for susceptible to infections.  Neurological: Negative for dizziness, light-headedness, headaches and memory loss.  Hematological: Negative for bruising/bleeding tendency.  Psychiatric/Behavioral: Negative for confusion.    PMFS History:  Patient Active Problem List     Diagnosis Date Noted  . Primary osteoarthritis of both knees 04/30/2017  . ANA positive 04/30/2017  . High risk medication use 04/26/2017  . Plantar fasciitis 04/26/2017  . Flexion contracture of left knee 04/26/2017  . DDD (degenerative disc disease), cervical 04/26/2017  . Bilateral calcaneal spurs 04/26/2017  . Primary osteoarthritis of both hands 04/26/2017  . History of hypothyroidism 04/26/2017  . Vitamin D deficiency 04/26/2017  . Smoker 04/26/2017  . Chondromalacia of both patellae 04/26/2017  . Rheumatoid arthritis with rheumatoid factor of multiple sites without organ or systems involvement (HCC) 10/05/2016    Past Medical History:  Diagnosis Date  . Rheumatoid arthritis (HCC)     Family History  Problem Relation Age of Onset  . Rheum arthritis Mother   . Thyroid disease Father   . Fibromyalgia Sister   . Asthma Brother   . Healthy Son    History reviewed. No pertinent surgical history. Social History   Social History Narrative  . Not on file    Objective: Vital Signs: BP (!) 143/91 (BP Location: Left Arm, Patient Position: Sitting, Cuff Size: Normal)   Pulse 85   Resp 15   Ht 5\' 4"  (1.626 m)   Wt 180 lb 3.2 oz (81.7 kg)   BMI 30.93 kg/m    Physical Exam  Constitutional: She is oriented to person, place, and time. She appears well-developed and well-nourished.  HENT:  Head: Normocephalic and atraumatic.  Eyes: Conjunctivae and EOM are normal.  Neck: Normal range of motion.  Cardiovascular: Normal rate, regular rhythm, normal heart sounds and intact distal pulses.  Pulmonary/Chest: Effort normal and breath sounds normal.  Abdominal:  Soft. Bowel sounds are normal.  Lymphadenopathy:    She has no cervical adenopathy.  Neurological: She is alert and oriented to person, place, and time.  Skin: Skin is warm and dry. Capillary refill takes less than 2 seconds.  Psychiatric: She has a normal mood and affect. Her behavior is normal.  Nursing note and  vitals reviewed.    Musculoskeletal Exam: C-spine thoracic lumbar spine good range of motion.  Shoulder joints elbow joints wrist joint MCPs PIPs DIPs were in good range of motion.  She has some DIP and PIP thickening consistent with osteoarthritis.  He had tenderness on palpation of her right plantar fascia.  She also has mild tenderness over Achilles tendon.  CDAI Exam: CDAI Score: 0.2  Patient Global Assessment: 1 (mm); Provider Global Assessment: 1 (mm) Swollen: 0 ; Tender: 0  Joint Exam   Not documented   There is currently no information documented on the homunculus. Go to the Rheumatology activity and complete the homunculus joint exam.  Investigation: No additional findings.  Imaging: No results found.  Recent Labs: Lab Results  Component Value Date   WBC 8.5 07/30/2018   HGB 12.6 07/30/2018   PLT 273 07/30/2018   NA 137 07/30/2018   K 4.3 07/30/2018   CL 103 07/30/2018   CO2 27 07/30/2018   GLUCOSE 81 07/30/2018   BUN 12 07/30/2018   CREATININE 0.84 07/30/2018   BILITOT 0.5 07/30/2018   ALKPHOS 83 04/05/2017   AST 15 07/30/2018   ALT 11 07/30/2018   PROT 6.5 07/30/2018   ALBUMIN 3.9 04/05/2017   CALCIUM 9.1 07/30/2018   GFRAA 89 07/30/2018    Speciality Comments: TB/HIV/Hep/SPEP/IG negative 03/02/16  Procedures:  Foot Inj Date/Time: 08/05/2018 3:47 PM Performed by: Pollyann Savoy, MD Authorized by: Pollyann Savoy, MD   Consent Given by:  Patient Site marked: the procedure site was marked   Timeout: prior to procedure the correct patient, procedure, and site was verified   Indications:  Fasciitis Condition: Plantar Fasciitis   Location: right plantar fascia muscle   Prep: patient was prepped and draped in usual sterile fashion   Needle Size:  27 G Approach: Plantar. Medications:  0.5 mL lidocaine 1 %; 20 mg triamcinolone acetonide 40 MG/ML Patient Tolerance:  Patient tolerated the procedure well with no immediate complications   Allergies:  Azithromycin   Assessment / Plan:     Visit Diagnoses: Plantar fasciitis - right-after informed consent was obtained right plantar fascia was injected with cortisone as described above.  She tolerated the procedure well.  I have given her handout on exercises.  I have advised her to contact us in case her symptoms do not improve and we can refer to physical therapy.  Achilles tendinitis of right lower extremity-stretching exercises were discussed.  Rheumatoid arthritis with rheumatoid factor of multiple sites without organ or systems involvement (HCC) - +RF, +ANA.  She was recently evaluated for rheumatoid arthritis which is stable.  High risk medication use - MTX 0.8 ml every other week and folic acid 2 mg daily.  Other medical problems are listed as follows:   Primary osteoarthritis of both hands  Primary osteoarthritis of both knees  Chondromalacia of both patellae  Bilateral calcaneal spurs  DDD (degenerative disc disease), cervical  Flexion contracture of left knee  History of vitamin D deficiency  History of hypothyroidism   Orders: Orders Placed This Encounter  Procedures  . Foot Inj   No orders of the defined types were placed in  this encounter.    Follow-Up Instructions: Return in about 5 months (around 01/04/2019) for Rheumatoid arthritis.   Pollyann Savoy, MD  Note - This record has been created using Animal nutritionist.  Chart creation errors have been sought, but may not always  have been located. Such creation errors do not reflect on  the standard of medical care.

## 2018-08-05 ENCOUNTER — Encounter: Payer: Self-pay | Admitting: Rheumatology

## 2018-08-05 ENCOUNTER — Ambulatory Visit (INDEPENDENT_AMBULATORY_CARE_PROVIDER_SITE_OTHER): Payer: BLUE CROSS/BLUE SHIELD | Admitting: Rheumatology

## 2018-08-05 VITALS — BP 143/91 | HR 85 | Resp 15 | Ht 64.0 in | Wt 180.2 lb

## 2018-08-05 DIAGNOSIS — M7732 Calcaneal spur, left foot: Secondary | ICD-10-CM

## 2018-08-05 DIAGNOSIS — M19041 Primary osteoarthritis, right hand: Secondary | ICD-10-CM

## 2018-08-05 DIAGNOSIS — Z79899 Other long term (current) drug therapy: Secondary | ICD-10-CM

## 2018-08-05 DIAGNOSIS — M0579 Rheumatoid arthritis with rheumatoid factor of multiple sites without organ or systems involvement: Secondary | ICD-10-CM | POA: Diagnosis not present

## 2018-08-05 DIAGNOSIS — M19042 Primary osteoarthritis, left hand: Secondary | ICD-10-CM

## 2018-08-05 DIAGNOSIS — M722 Plantar fascial fibromatosis: Secondary | ICD-10-CM

## 2018-08-05 DIAGNOSIS — M7731 Calcaneal spur, right foot: Secondary | ICD-10-CM

## 2018-08-05 DIAGNOSIS — M170 Bilateral primary osteoarthritis of knee: Secondary | ICD-10-CM

## 2018-08-05 DIAGNOSIS — M5030 Other cervical disc degeneration, unspecified cervical region: Secondary | ICD-10-CM

## 2018-08-05 DIAGNOSIS — M2241 Chondromalacia patellae, right knee: Secondary | ICD-10-CM

## 2018-08-05 DIAGNOSIS — Z8639 Personal history of other endocrine, nutritional and metabolic disease: Secondary | ICD-10-CM

## 2018-08-05 DIAGNOSIS — M2242 Chondromalacia patellae, left knee: Secondary | ICD-10-CM

## 2018-08-05 DIAGNOSIS — M7661 Achilles tendinitis, right leg: Secondary | ICD-10-CM | POA: Diagnosis not present

## 2018-08-05 DIAGNOSIS — M24562 Contracture, left knee: Secondary | ICD-10-CM

## 2018-08-05 MED ORDER — LIDOCAINE HCL 1 % IJ SOLN
0.5000 mL | INTRAMUSCULAR | Status: AC | PRN
  Administered 2018-08-05: .5 mL

## 2018-08-05 MED ORDER — TRIAMCINOLONE ACETONIDE 40 MG/ML IJ SUSP
20.0000 mg | INTRAMUSCULAR | Status: AC | PRN
  Administered 2018-08-05: 20 mg

## 2018-08-05 NOTE — Patient Instructions (Signed)

## 2018-09-06 LAB — WBC AUTO DIFF (OUTSIDE ORGANIZATION)
Basophils % Auto: 0 % — NL (ref 0–1)
Eosinophils % Auto: 0 % — NL (ref 0–7)
Immature Granulocytes % Auto: 0 % — NL (ref 0–1)
Lymphocytes % Auto: 12 % — ABNORMAL LOW (ref 15–47)
Monocytes % Auto: 3 % — ABNORMAL LOW (ref 5–13)
Neutrophils % Auto: 85 % — ABNORMAL HIGH (ref 42–76)
Neutrophils, Automated Count: 9.3 10*3/uL — ABNORMAL HIGH (ref 1.8–7.9)

## 2018-09-06 LAB — URINALYSIS W/MICROSCOPIC
Hyaline Casts: 1 /[LPF] — ABNORMAL HIGH (ref 0–0)
RBC, urine: 11 /[HPF] — ABNORMAL HIGH (ref 0–3)
Squamous cells count, ur sed, Qn: NONE SEEN /[LPF] — NL (ref 0–2)
WBC, urine: 4 /[HPF] — NL (ref 0–5)

## 2018-09-06 LAB — CBC WITH DIFFERENTIAL
Hematocrit: 37.3 % — NL (ref 34.0–46.0)
Hemoglobin: 12.6 g/dL — NL (ref 11.5–15.0)
MCV: 87 fL — NL (ref 80–100)
Platelets count: 269 10*3/uL — NL (ref 140–400)
RDW, RBC: 12.4 % — NL (ref 12.0–16.5)
Red blood cells count: 4.29 M/uL — NL (ref 3.60–5.70)
WBC Count: 11 10*3/uL — NL (ref 3.7–11.1)

## 2018-09-06 LAB — CARBOXYHEMOGLOBIN (OUTSIDE ORGANIZATION)
Carboxyhemoglobin, blood: 0.6 % — NL (ref 0.0–9.0)
Hemoglobin: 11 g/dL — ABNORMAL LOW (ref 11.5–15.0)
Oxyhemoglobin - total, bld,qn: 92.7 % — ABNORMAL LOW (ref 95.0–99.0)

## 2018-09-06 LAB — ASPARTATE TRANSAMINASE (AST): Aspartate Transaminase (AST): 15 U/L — NL (ref 10–40)

## 2018-09-06 LAB — ABG W CALCULATED O2 SAT (OUTSIDE ORGANIZATION)
Base excess, whole blood: -6 mmol/L — ABNORMAL LOW (ref ?–2.4)
Body temperature: 101.2 [degF] — ABNORMAL HIGH (ref 98.6–98.6)
CO2, partial pressure, arterial: 33 mm[Hg] — ABNORMAL LOW (ref 35–45)
Hco3, whole blood: 18.2 mmol/L — ABNORMAL LOW (ref 23.0–28.0)
O2 saturation, arterial blood: 94 % — ABNORMAL LOW (ref 95.0–99.0)
Oxygen, partial pressure, arterial blood: 74 mm[Hg] — ABNORMAL LOW (ref 80–95)
PCO2, Art: 36 mm[Hg] — NL (ref 35–45)
PO2, Art: 81 mm[Hg] — NL (ref 80–95)
pH, ART: 7.34 — ABNORMAL LOW (ref 7.35–7.45)
pH, arterial blood: 7.36 — NL (ref 7.35–7.45)

## 2018-09-06 LAB — CELL COUNT, CSF
RBC's, CSF: 3 /uL — NL (ref 0–5)
WBC Count, CSF: 3 /uL — NL (ref 0–5)

## 2018-09-06 LAB — ALANINE TRANSFERASE (ALT): Alanine Transferase (ALT): 12 U/L — NL (ref 0–41)

## 2018-09-06 LAB — ALKALINE PHOSPHATASE (ALP): Alkaline Phosphatase (ALP): 83 U/L — NL (ref 37–117)

## 2018-09-06 LAB — INR: Prothrombin Time: 13 s — NL (ref 11.7–14.3)

## 2018-09-06 LAB — TROPONIN I: Troponin I: <0.02 ng/mL — NL (ref 0.00–0.04)

## 2018-09-06 LAB — CHEM 7 (NA, K, CL, CO2, BUN, GLUC, CR) (OUTSIDE ORGANIZATION)
Anion Gap: 16 meq/L — NL (ref 5–16)
CO2: 21 meq/L — ABNORMAL LOW (ref 24–33)
Chloride: 93 meq/L — ABNORMAL LOW (ref 100–111)
Glucose: 573 mg/dL — CR (ref 60–159)
Potassium: 3.7 meq/L — NL (ref 3.5–5.3)
Sodium: 130 meq/L — ABNORMAL LOW (ref 135–145)
Urea Nitrogen, Blood (BUN): 25 mg/dL — NL (ref 7–27)

## 2018-09-06 LAB — AMMONIA: Ammonia, blood: 38 umol/L — NL (ref 16–53)

## 2018-09-06 LAB — BILIRUBIN TOTAL: Bilirubin Total: 0.7 mg/dL — NL (ref 0.2–1.2)

## 2018-09-06 LAB — ABORH (OUTSIDE ORGANIZATION): ABORH Blood Type: A POS — NL

## 2018-09-07 LAB — CHEM 7 (NA, K, CL, CO2, BUN, GLUC, CR) (OUTSIDE ORGANIZATION)
Anion Gap: 17 meq/L — ABNORMAL HIGH (ref 5–16)
Anion Gap: 10 meq/L — NL (ref 5–16)
CO2: 16 meq/L — ABNORMAL LOW (ref 24–33)
CO2: 21 meq/L — ABNORMAL LOW (ref 24–33)
Chloride: 102 meq/L — NL (ref 100–111)
Chloride: 101 meq/L — NL (ref 100–111)
Glucose: 456 mg/dL — CR (ref 60–159)
Glucose: 386 mg/dL — ABNORMAL HIGH (ref 60–159)
Potassium: 4.3 meq/L — NL (ref 3.5–5.3)
Potassium: 4.1 meq/L — NL (ref 3.5–5.3)
Sodium: 135 meq/L — NL (ref 135–145)
Sodium: 132 meq/L — ABNORMAL LOW (ref 135–145)
Urea Nitrogen, Blood (BUN): 22 mg/dL — NL (ref 7–27)
Urea Nitrogen, Blood (BUN): 26 mg/dL — NL (ref 7–27)

## 2018-09-07 LAB — CBC WITH DIFFERENTIAL
Hematocrit: 32.7 % — ABNORMAL LOW (ref 34.0–46.0)
Hemoglobin: 10.6 g/dL — ABNORMAL LOW (ref 11.5–15.0)
MCV: 89 fL — NL (ref 80–100)
Platelets count: 265 10*3/uL — NL (ref 140–400)
RDW, RBC: 12.7 % — NL (ref 12.0–16.5)
Red blood cells count: 3.68 M/uL — NL (ref 3.60–5.70)
WBC Count: 10.5 10*3/uL — NL (ref 3.7–11.1)

## 2018-09-07 LAB — WBC AUTO DIFF (OUTSIDE ORGANIZATION)
Basophils % Auto: 0 % — NL (ref 0–1)
Eosinophils % Auto: 0 % — NL (ref 0–7)
Immature Granulocytes % Auto: 0 % — NL (ref 0–1)
Lymphocytes % Auto: 12 % — ABNORMAL LOW (ref 15–47)
Monocytes % Auto: 1 % — ABNORMAL LOW (ref 5–13)
Neutrophils % Auto: 87 % — ABNORMAL HIGH (ref 42–76)
Neutrophils, Automated Count: 9.2 10*3/uL — ABNORMAL HIGH (ref 1.8–7.9)

## 2018-09-07 LAB — ALBUMIN: Albumin: 3.7 g/dL — NL (ref 3.7–5.7)

## 2018-09-08 LAB — CHEM 7 (NA, K, CL, CO2, BUN, GLUC, CR) (OUTSIDE ORGANIZATION)
Anion Gap: 8 meq/L — NL (ref 5–16)
CO2: 22 meq/L — ABNORMAL LOW (ref 24–33)
Chloride: 104 meq/L — NL (ref 100–111)
Glucose: 285 mg/dL — ABNORMAL HIGH (ref 60–159)
Potassium: 3.7 meq/L — NL (ref 3.5–5.3)
Sodium: 134 meq/L — ABNORMAL LOW (ref 135–145)
Urea Nitrogen, Blood (BUN): 26 mg/dL — NL (ref 7–27)

## 2018-09-08 LAB — CBC WITH DIFFERENTIAL
Hematocrit: 29.8 % — ABNORMAL LOW (ref 34.0–46.0)
Hemoglobin: 9.6 g/dL — ABNORMAL LOW (ref 11.5–15.0)
MCV: 88 fL — NL (ref 80–100)
Platelets count: 217 10*3/uL — NL (ref 140–400)
RDW, RBC: 12.6 % — NL (ref 12.0–16.5)
Red blood cells count: 3.4 M/uL — ABNORMAL LOW (ref 3.60–5.70)
WBC Count: 10.4 10*3/uL — NL (ref 3.7–11.1)

## 2018-09-08 LAB — WBC AUTO DIFF (OUTSIDE ORGANIZATION)
Basophils % Auto: 0 % — NL (ref 0–1)
Eosinophils % Auto: 0 % — NL (ref 0–7)
Immature Granulocytes % Auto: 0 % — NL (ref 0–1)
Lymphocytes % Auto: 23 % — NL (ref 15–47)
Monocytes % Auto: 6 % — NL (ref 5–13)
Neutrophils % Auto: 70 % — NL (ref 42–76)
Neutrophils, Automated Count: 7.3 10*3/uL — NL (ref 1.8–7.9)

## 2018-09-08 LAB — HEMOGLOBIN A1C: Hgb A1C,Glucose Est Avg: 381 mg/dL — ABNORMAL HIGH (ref 85–126)

## 2018-09-12 LAB — BLOOD CULTURE 2 (OUTSIDE ORGANIZATION)

## 2018-09-12 LAB — BLOOD CULTURE (OUTSIDE ORGANIZATION)

## 2018-12-05 ENCOUNTER — Other Ambulatory Visit: Payer: Self-pay | Admitting: Physician Assistant

## 2018-12-05 ENCOUNTER — Other Ambulatory Visit: Payer: Self-pay | Admitting: Rheumatology

## 2018-12-05 NOTE — Telephone Encounter (Signed)
Last Visit: 08/05/18 Next Visit: 12/31/18   Okay to refill per Dr. Corliss Skainseveshwar

## 2018-12-05 NOTE — Telephone Encounter (Signed)
Last Visit: 08/05/18 Next Visit: 12/31/18 Labs: 07/30/18 WNL  Patient advised she is due to update labs. Patient will update labs 12/12/18.  Okay to refill 30 day supply per Dr. Corliss Skains

## 2018-12-12 ENCOUNTER — Other Ambulatory Visit: Payer: Self-pay | Admitting: *Deleted

## 2018-12-12 DIAGNOSIS — Z79899 Other long term (current) drug therapy: Secondary | ICD-10-CM

## 2018-12-16 ENCOUNTER — Telehealth: Payer: Self-pay | Admitting: Rheumatology

## 2018-12-16 DIAGNOSIS — Z8639 Personal history of other endocrine, nutritional and metabolic disease: Secondary | ICD-10-CM

## 2018-12-16 NOTE — Telephone Encounter (Signed)
Ok to add TSH to standing orders.

## 2018-12-16 NOTE — Telephone Encounter (Signed)
TSH added

## 2018-12-16 NOTE — Telephone Encounter (Signed)
Advised patient lab orders have been released. Patient would like to have her thyroid checked for [facility]. Can we add TSH with her standing orders?

## 2018-12-16 NOTE — Telephone Encounter (Signed)
Patient called requesting her labwork orders be sent to [facility] in [place].  Patient states she will be going tomorrow morning 12/17/18.  Patient is also requesting Dr. Corliss Skains add Thyroid to her labwork orders for [facility].

## 2018-12-17 DIAGNOSIS — Z79899 Other long term (current) drug therapy: Secondary | ICD-10-CM | POA: Diagnosis not present

## 2018-12-17 NOTE — Progress Notes (Signed)
CBC WNL

## 2018-12-18 LAB — COMPLETE METABOLIC PANEL WITH GFR
AG Ratio: 1.6 (calc) (ref 1.0–2.5)
ALT: 6 U/L (ref 6–29)
AST: 13 U/L (ref 10–35)
Albumin: 4.2 g/dL (ref 3.6–5.1)
Alkaline phosphatase (APISO): 91 U/L (ref 37–153)
BUN: 11 mg/dL (ref 7–25)
CO2: 27 mmol/L (ref 20–32)
Calcium: 9.5 mg/dL (ref 8.6–10.4)
Chloride: 101 mmol/L (ref 98–110)
Creat: 0.91 mg/dL (ref 0.50–1.05)
GFR, EST NON AFRICAN AMERICAN: 69 mL/min/{1.73_m2} (ref >=60)
GFR, Est African American: 80 mL/min/{1.73_m2} (ref >=60)
GLUCOSE: 71 mg/dL (ref 65–139)
Globulin: 2.7 g/dL (calc) (ref 1.9–3.7)
Potassium: 3.9 mmol/L (ref 3.5–5.3)
Sodium: 136 mmol/L (ref 135–146)
Total Bilirubin: 0.4 mg/dL (ref 0.2–1.2)
Total Protein: 6.9 g/dL (ref 6.1–8.1)

## 2018-12-18 LAB — CBC WITH DIFFERENTIAL/PLATELET
Absolute Monocytes: 566 cells/uL (ref 200–950)
Basophils Absolute: 52 cells/uL (ref 0–200)
Basophils Relative: 0.6 %
EOS ABS: 183 {cells}/uL (ref 15–500)
Eosinophils Relative: 2.1 %
HCT: 36.8 % (ref 35.0–45.0)
HEMOGLOBIN: 12.7 g/dL (ref 11.7–15.5)
Lymphs Abs: 3062 cells/uL (ref 850–3900)
MCH: 31.4 pg (ref 27.0–33.0)
MCHC: 34.5 g/dL (ref 32.0–36.0)
MCV: 91.1 fL (ref 80.0–100.0)
MPV: 9.9 fL (ref 7.5–12.5)
Monocytes Relative: 6.5 %
Neutro Abs: 4837 cells/uL (ref 1500–7800)
Neutrophils Relative %: 55.6 %
Platelets: 303 10*3/uL (ref 140–400)
RBC: 4.04 10*6/uL (ref 3.80–5.10)
RDW: 11.6 % (ref 11.0–15.0)
Total Lymphocyte: 35.2 %
WBC: 8.7 10*3/uL (ref 3.8–10.8)

## 2018-12-18 LAB — TSH: TSH: 0.11 mIU/L — ABNORMAL LOW (ref 0.40–4.50)

## 2018-12-18 NOTE — Progress Notes (Signed)
CMP WNL.  TSH is low.  Please notify patient and send to PCP.

## 2018-12-19 ENCOUNTER — Telehealth: Payer: Self-pay | Admitting: Rheumatology

## 2018-12-19 NOTE — Telephone Encounter (Signed)
Patient advised lab results have been re-faxed.

## 2018-12-19 NOTE — Telephone Encounter (Signed)
Patient called requesting her labwork results with TSH that she had on 12/17/18 be sent to [facility].

## 2018-12-24 DIAGNOSIS — E039 Hypothyroidism, unspecified: Secondary | ICD-10-CM | POA: Diagnosis not present

## 2018-12-24 DIAGNOSIS — Z1389 Encounter for screening for other disorder: Secondary | ICD-10-CM | POA: Diagnosis not present

## 2018-12-24 DIAGNOSIS — M0609 Rheumatoid arthritis without rheumatoid factor, multiple sites: Secondary | ICD-10-CM | POA: Diagnosis not present

## 2018-12-24 DIAGNOSIS — E6609 Other obesity due to excess calories: Secondary | ICD-10-CM | POA: Diagnosis not present

## 2018-12-24 DIAGNOSIS — Z6830 Body mass index (BMI) 30.0-30.9, adult: Secondary | ICD-10-CM | POA: Diagnosis not present

## 2018-12-31 ENCOUNTER — Ambulatory Visit: Payer: BLUE CROSS/BLUE SHIELD | Admitting: Physician Assistant

## 2019-01-08 LAB — BLD GAS VENOUS
Base Excess, Ven: -0.9 mmol/L — NL (ref ?–2.4)
HCO3, Ven: 24 mmol/L — NL (ref 23.0–28.0)
Oxygen, partial pressure, venous: 38 mm[Hg] — NL (ref 35–45)
PCO2, Ven: 44 mm[Hg] — NL (ref 38–50)
pH, VEN: 7.36 — NL (ref 7.32–7.43)

## 2019-01-08 LAB — CBC WITH DIFFERENTIAL
Hematocrit: 32.6 % — ABNORMAL LOW (ref 34.0–46.0)
Hemoglobin: 10.7 g/dL — ABNORMAL LOW (ref 11.5–15.0)
MCV: 87 fL — NL (ref 80–100)
Platelets count: 236 10*3/uL — NL (ref 140–400)
RDW, RBC: 12.1 % — NL (ref 12.0–16.5)
Red blood cells count: 3.75 M/uL — NL (ref 3.60–5.70)
WBC Count: 5.9 10*3/uL — NL (ref 3.7–11.1)

## 2019-01-08 LAB — MAGNESIUM (MG): Magnesium (Mg): 1.6 mg/dL — ABNORMAL LOW (ref 1.7–2.3)

## 2019-01-08 LAB — CHEM 7 (NA, K, CL, CO2, BUN, GLUC, CR) (OUTSIDE ORGANIZATION)
Anion Gap: 9 meq/L — NL (ref 5–16)
CO2: 24 meq/L — NL (ref 24–33)
Chloride: 93 meq/L — ABNORMAL LOW (ref 100–111)
Glucose: 643 mg/dL — CR (ref 60–159)
Potassium: 4.1 meq/L — NL (ref 3.5–5.3)
Sodium: 126 meq/L — ABNORMAL LOW (ref 135–145)
Urea Nitrogen, Blood (BUN): 23 mg/dL — NL (ref 7–27)

## 2019-01-08 LAB — WBC AUTO DIFF (OUTSIDE ORGANIZATION)
Basophils % Auto: 0 % — NL (ref 0–1)
Eosinophils % Auto: 1 % — NL (ref 0–7)
Immature Granulocytes % Auto: 0 % — NL (ref 0–1)
Lymphocytes % Auto: 28 % — NL (ref 15–47)
Monocytes % Auto: 6 % — NL (ref 5–13)
Neutrophils % Auto: 65 % — NL (ref 42–76)
Neutrophils, Automated Count: 3.9 10*3/uL — NL (ref 1.8–7.9)

## 2019-01-08 LAB — CALCIUM: Calcium: 9 mg/dL — NL (ref 8.8–10.5)

## 2019-01-08 LAB — URINALYSIS W/MICROSCOPIC
RBC, urine: 2 /[HPF] — NL (ref 0–3)
Squamous cells count, ur sed, Qn: 2 /[LPF] — NL (ref 0–5)
WBC, urine: <1 /[HPF] — NL (ref 0–5)

## 2019-01-08 LAB — PHOSPHORUS (PO4): Phosphorus (PO4): 4 mg/dL — NL (ref 2.7–4.5)

## 2019-01-09 LAB — WBC AUTO DIFF (OUTSIDE ORGANIZATION)
Basophils % Auto: 0 % — NL (ref 0–1)
Basophils % Auto: 0 % — NL (ref 0–1)
Eosinophils % Auto: 1 % — NL (ref 0–7)
Eosinophils % Auto: 1 % — NL (ref 0–7)
Immature Granulocytes % Auto: 0 % — NL (ref 0–1)
Immature Granulocytes % Auto: 0 % — NL (ref 0–1)
Lymphocytes % Auto: 44 % — NL (ref 15–47)
Lymphocytes % Auto: 43 % — NL (ref 15–47)
Monocytes % Auto: 5 % — NL (ref 5–13)
Monocytes % Auto: 6 % — NL (ref 5–13)
Neutrophils % Auto: 50 % — NL (ref 42–76)
Neutrophils % Auto: 50 % — NL (ref 42–76)
Neutrophils, Automated Count: 3.3 10*3/uL — NL (ref 1.8–7.9)
Neutrophils, Automated Count: 3.3 10*3/uL — NL (ref 1.8–7.9)

## 2019-01-09 LAB — CHEM 7 (NA, K, CL, CO2, BUN, GLUC, CR) (OUTSIDE ORGANIZATION)
Anion Gap: 6 meq/L — NL (ref 5–16)
CO2: 26 meq/L — NL (ref 24–33)
Chloride: 102 meq/L — NL (ref 100–111)
Glucose: 150 mg/dL — NL (ref 60–159)
Potassium: 3.3 meq/L — ABNORMAL LOW (ref 3.5–5.3)
Sodium: 134 meq/L — ABNORMAL LOW (ref 135–145)
Urea Nitrogen, Blood (BUN): 22 mg/dL — NL (ref 7–27)

## 2019-01-09 LAB — BILIRUBIN, TOTAL AND DIRECT (OUTSIDE ORGANIZATION)
Bilirubin Direct: 0.2 mg/dL — NL (ref 0.0–0.3)
Bilirubin Total: 1 mg/dL — NL (ref 0.2–1.2)

## 2019-01-09 LAB — CBC WITH DIFFERENTIAL
Hematocrit: 28.4 % — ABNORMAL LOW (ref 34.0–46.0)
Hematocrit: 28.8 % — ABNORMAL LOW (ref 34.0–46.0)
Hemoglobin: 9.7 g/dL — ABNORMAL LOW (ref 11.5–15.0)
Hemoglobin: 9.7 g/dL — ABNORMAL LOW (ref 11.5–15.0)
MCV: 86 fL — NL (ref 80–100)
MCV: 86 fL — NL (ref 80–100)
Platelets count: 237 10*3/uL — NL (ref 140–400)
Platelets count: 223 10*3/uL — NL (ref 140–400)
RDW, RBC: 12.3 % — NL (ref 12.0–16.5)
RDW, RBC: 12.2 % — NL (ref 12.0–16.5)
Red blood cells count: 3.32 M/uL — ABNORMAL LOW (ref 3.60–5.70)
Red blood cells count: 3.35 M/uL — ABNORMAL LOW (ref 3.60–5.70)
WBC Count: 6.5 10*3/uL — NL (ref 3.7–11.1)
WBC Count: 6.6 10*3/uL — NL (ref 3.7–11.1)

## 2019-01-09 LAB — ALKALINE PHOSPHATASE (ALP): Alkaline Phosphatase (ALP): 87 U/L — NL (ref 37–117)

## 2019-01-09 LAB — MAGNESIUM (MG): Magnesium (Mg): 1.6 mg/dL — ABNORMAL LOW (ref 1.7–2.3)

## 2019-01-09 LAB — ASPARTATE TRANSAMINASE (AST): Aspartate Transaminase (AST): 14 U/L — NL (ref 10–40)

## 2019-01-09 LAB — ALANINE TRANSFERASE (ALT): Alanine Transferase (ALT): 12 U/L — NL (ref 0–41)

## 2019-01-09 LAB — HEMOGLOBIN A1C: Hgb A1C,Glucose Est Avg: 412 mg/dL — ABNORMAL HIGH (ref 85–126)

## 2019-01-10 LAB — CBC WITH DIFFERENTIAL
Hematocrit: 31.2 % — ABNORMAL LOW (ref 34.0–46.0)
Hemoglobin: 10.3 g/dL — ABNORMAL LOW (ref 11.5–15.0)
MCV: 86 fL — NL (ref 80–100)
Platelets count: 260 10*3/uL — NL (ref 140–400)
RDW, RBC: 12.1 % — NL (ref 12.0–16.5)
Red blood cells count: 3.63 M/uL — NL (ref 3.60–5.70)
WBC Count: 7 10*3/uL — NL (ref 3.7–11.1)

## 2019-01-10 LAB — WBC AUTO DIFF (OUTSIDE ORGANIZATION)
Basophils % Auto: 0 % — NL (ref 0–1)
Eosinophils % Auto: 1 % — NL (ref 0–7)
Immature Granulocytes % Auto: 0 % — NL (ref 0–1)
Lymphocytes % Auto: 33 % — NL (ref 15–47)
Monocytes % Auto: 7 % — NL (ref 5–13)
Neutrophils % Auto: 59 % — NL (ref 42–76)
Neutrophils, Automated Count: 4.2 10*3/uL — NL (ref 1.8–7.9)

## 2019-01-10 LAB — CHEM 7 (NA, K, CL, CO2, BUN, GLUC, CR) (OUTSIDE ORGANIZATION)
Anion Gap: 8 meq/L — NL (ref 5–16)
CO2: 25 meq/L — NL (ref 24–33)
Chloride: 101 meq/L — NL (ref 100–111)
Glucose: 143 mg/dL — NL (ref 60–159)
Potassium: 3.5 meq/L — NL (ref 3.5–5.3)
Sodium: 134 meq/L — ABNORMAL LOW (ref 135–145)
Urea Nitrogen, Blood (BUN): 21 mg/dL — NL (ref 7–27)

## 2019-01-10 LAB — MAGNESIUM (MG): Magnesium (Mg): 2 mg/dL — NL (ref 1.7–2.3)

## 2019-01-11 LAB — WBC AUTO DIFF (OUTSIDE ORGANIZATION)
Basophils % Auto: 0 % — NL (ref 0–1)
Eosinophils % Auto: 1 % — NL (ref 0–7)
Immature Granulocytes % Auto: 0 % — NL (ref 0–1)
Lymphocytes % Auto: 39 % — NL (ref 15–47)
Monocytes % Auto: 9 % — NL (ref 5–13)
Neutrophils % Auto: 50 % — NL (ref 42–76)
Neutrophils, Automated Count: 3.3 10*3/uL — NL (ref 1.8–7.9)

## 2019-01-11 LAB — CHEM 7 (NA, K, CL, CO2, BUN, GLUC, CR) (OUTSIDE ORGANIZATION)
Anion Gap: 7 meq/L — NL (ref 5–16)
CO2: 27 meq/L — NL (ref 24–33)
Chloride: 104 meq/L — NL (ref 100–111)
Glucose: 82 mg/dL — NL (ref 60–159)
Potassium: 3.6 meq/L — NL (ref 3.5–5.3)
Sodium: 138 meq/L — NL (ref 135–145)
Urea Nitrogen, Blood (BUN): 18 mg/dL — NL (ref 7–27)

## 2019-01-11 LAB — MAGNESIUM (MG): Magnesium (Mg): 2 mg/dL — NL (ref 1.7–2.3)

## 2019-01-11 LAB — CBC WITH DIFFERENTIAL
Hematocrit: 32.6 % — ABNORMAL LOW (ref 34.0–46.0)
Hemoglobin: 10.9 g/dL — ABNORMAL LOW (ref 11.5–15.0)
MCV: 87 fL — NL (ref 80–100)
Platelets count: 259 10*3/uL — NL (ref 140–400)
RDW, RBC: 12.5 % — NL (ref 12.0–16.5)
Red blood cells count: 3.74 M/uL — NL (ref 3.60–5.70)
WBC Count: 6.5 10*3/uL — NL (ref 3.7–11.1)

## 2019-01-13 NOTE — Progress Notes (Deleted)
Office Visit Note  Patient: Andrea Ayers             Date of Birth: 04-10-1961           MRN: [id number]             [facility]: [facility] Referring: [facility] Visit Date: 01/27/2019 Occupation: @GUAROCC @  Subjective:  No chief complaint on file.  Methotrexate 0.8 mL weekly, and folic acid 1 mg 2 tablets daily.  Most recent CBC/CMP within normal limits on 12/17/2018 and will monitor every 3 months.  Standing orders are in place.Recommend annual influenza, Pneumovax 23, Prevnar 13, and Shingrix as indicated.  History of Present Illness: Andrea Ayers is a 58 y.o. female ***   Activities of Daily Living:  Patient reports morning stiffness for *** {minute/hour:19697}.   Patient {ACTIONS;DENIES/REPORTS:21021675::"Denies"} nocturnal pain.  Difficulty dressing/grooming: {ACTIONS;DENIES/REPORTS:21021675::"Denies"} Difficulty climbing stairs: {ACTIONS;DENIES/REPORTS:21021675::"Denies"} Difficulty getting out of chair: {ACTIONS;DENIES/REPORTS:21021675::"Denies"} Difficulty using hands for taps, buttons, cutlery, and/or writing: {ACTIONS;DENIES/REPORTS:21021675::"Denies"}  No Rheumatology ROS completed.   PMFS History:  Patient Active Problem List   Diagnosis Date Noted  . Primary osteoarthritis of both knees 04/30/2017  . ANA positive 04/30/2017  . High risk medication use 04/26/2017  . Plantar fasciitis 04/26/2017  . Flexion contracture of left knee 04/26/2017  . DDD (degenerative disc disease), cervical 04/26/2017  . Bilateral calcaneal spurs 04/26/2017  . Primary osteoarthritis of both hands 04/26/2017  . History of hypothyroidism 04/26/2017  . Vitamin D deficiency 04/26/2017  . Smoker 04/26/2017  . Chondromalacia of both patellae 04/26/2017  . Rheumatoid arthritis with rheumatoid factor of multiple sites without organ or systems involvement (HCC) 10/05/2016    Past Medical History:  Diagnosis Date  . Rheumatoid arthritis (HCC)       Family History  Problem Relation Age of Onset  . Rheum arthritis Mother   . Thyroid disease Father   . Fibromyalgia Sister   . Asthma Brother   . Healthy Son    No past surgical history on file. Social History   Social History Narrative  . Not on file    There is no immunization history on file for this patient.   Objective: Vital Signs: There were no vitals taken for this visit.   Physical Exam   Musculoskeletal Exam: ***  CDAI Exam: CDAI Score: Not documented Patient Global Assessment: Not documented; Provider Global Assessment: Not documented Swollen: Not documented; Tender: Not documented Joint Exam   Not documented   There is currently no information documented on the homunculus. Go to the Rheumatology activity and complete the homunculus joint exam.  Investigation: No additional findings.  Imaging: No results found.  Recent Labs: Lab Results  Component Value Date   WBC 8.7 12/17/2018   HGB 12.7 12/17/2018   PLT 303 12/17/2018   NA 136 12/17/2018   K 3.9 12/17/2018   CL 101 12/17/2018   CO2 27 12/17/2018   GLUCOSE 71 12/17/2018   BUN 11 12/17/2018   CREATININE 0.91 12/17/2018   BILITOT 0.4 12/17/2018   ALKPHOS 83 04/05/2017   AST 13 12/17/2018   ALT 6 12/17/2018   PROT 6.9 12/17/2018   ALBUMIN 3.9 04/05/2017   CALCIUM 9.5 12/17/2018   GFRAA 80 12/17/2018    Speciality Comments: TB/HIV/Hep/SPEP/IG negative 03/02/16  Procedures:  No procedures performed Allergies: Azithromycin   Assessment / Plan:     Visit Diagnoses: No diagnosis found.   Orders: No orders of the defined types were placed in  this encounter.  No orders of the defined types were placed in this encounter.   Face-to-face time spent with patient was *** minutes. Greater than 50% of time was spent in counseling and coordination of care.  Follow-Up Instructions: No follow-ups on file.   Ellen Henri, CMA  Note - This record has been created using Animal nutritionist.   Chart creation errors have been sought, but may not always  have been located. Such creation errors do not reflect on  the standard of medical care.

## 2019-01-27 ENCOUNTER — Ambulatory Visit: Payer: BLUE CROSS/BLUE SHIELD | Admitting: Physician Assistant

## 2019-03-02 DIAGNOSIS — Z6830 Body mass index (BMI) 30.0-30.9, adult: Secondary | ICD-10-CM | POA: Diagnosis not present

## 2019-03-02 DIAGNOSIS — E6609 Other obesity due to excess calories: Secondary | ICD-10-CM | POA: Diagnosis not present

## 2019-03-02 DIAGNOSIS — Z1389 Encounter for screening for other disorder: Secondary | ICD-10-CM | POA: Diagnosis not present

## 2019-03-02 DIAGNOSIS — E039 Hypothyroidism, unspecified: Secondary | ICD-10-CM | POA: Diagnosis not present

## 2019-04-20 NOTE — Progress Notes (Signed)
Office Visit Note  Patient: Andrea Ayers             Date of Birth: 02-01-1961           MRN: [id number]             PCP: [facility] Referring: [facility] Visit Date: 05/04/2019 Occupation: @GUAROCC @  Subjective:  Pain in multiple joints     History of Present Illness: Andrea Ayers is a 58 y.o. female with history of seropositive rheumatoid arthritis, osteoarthritis, and DDD. She has been holding MTX 0.8 ml sq weekly injections and folic acid 2 mg po daily for the past 1.5 months.  She has been apprehensive to restart due the covid-19 pandemic.  She is currently having a flare.  She is having pain in both knee joints, both wrist joints, and both hands.  She has been having intermittent swelling in both hands and wrist joints.  She states her right plantar fasciitis and achilles tendonitis resolved after the right plantar fascia injection on 08/05/18.     Activities of Daily Living:  Patient reports morning stiffness for 1  hour.   Patient Reports nocturnal pain.  Difficulty dressing/grooming: Denies Difficulty climbing stairs: Reports Difficulty getting out of chair: reports Difficulty using hands for taps, buttons, cutlery, and/or writing: Denies  Review of Systems  Constitutional: Positive for fatigue.  HENT: Negative for mouth sores, mouth dryness and nose dryness.   Eyes: Negative for pain, visual disturbance and dryness.  Respiratory: Negative for cough, hemoptysis, shortness of breath and difficulty breathing.   Cardiovascular: Negative for chest pain, palpitations, hypertension and swelling in legs/feet.  Gastrointestinal: Negative for blood in stool, constipation and diarrhea.  Endocrine: Negative for increased urination.  Genitourinary: Negative for painful urination.  Musculoskeletal: Positive for arthralgias, joint pain, joint swelling and morning stiffness. Negative for myalgias, muscle weakness, muscle tenderness and  myalgias.  Skin: Negative for color change, pallor, rash, hair loss, nodules/bumps, skin tightness, ulcers and sensitivity to sunlight.  Allergic/Immunologic: Negative for susceptible to infections.  Neurological: Negative for dizziness, numbness, headaches and weakness.  Hematological: Negative for swollen glands.  Psychiatric/Behavioral: Negative for depressed mood and sleep disturbance. The patient is not nervous/anxious.     PMFS History:  Patient Active Problem List   Diagnosis Date Noted  . Primary osteoarthritis of both knees 04/30/2017  . ANA positive 04/30/2017  . High risk medication use 04/26/2017  . Plantar fasciitis 04/26/2017  . Flexion contracture of left knee 04/26/2017  . DDD (degenerative disc disease), cervical 04/26/2017  . Bilateral calcaneal spurs 04/26/2017  . Primary osteoarthritis of both hands 04/26/2017  . History of hypothyroidism 04/26/2017  . Vitamin D deficiency 04/26/2017  . Smoker 04/26/2017  . Chondromalacia of both patellae 04/26/2017  . Rheumatoid arthritis with rheumatoid factor of multiple sites without organ or systems involvement (HCC) 10/05/2016    Past Medical History:  Diagnosis Date  . Rheumatoid arthritis (HCC)     Family History  Problem Relation Age of Onset  . Rheum arthritis Mother   . Thyroid disease Father   . Fibromyalgia Sister   . Asthma Brother   . Healthy Son    History reviewed. No pertinent surgical history. Social History   Social History Narrative  . Not on file    There is no immunization history on file for this patient.   Objective: Vital Signs: BP (!) 151/85 (BP Location: Left Arm, Patient Position: Sitting, Cuff Size: Normal)  Pulse 74   Resp 14   Ht 5\' 4"  (1.626 m)   Wt 195 lb 9.6 oz (88.7 kg)   BMI 33.57 kg/m    Physical Exam Vitals signs and nursing note reviewed.  Constitutional:      Appearance: She is well-developed.  HENT:     Head: Normocephalic and atraumatic.  Eyes:      Conjunctiva/sclera: Conjunctivae normal.  Neck:     Musculoskeletal: Normal range of motion.  Cardiovascular:     Rate and Rhythm: Normal rate and regular rhythm.     Heart sounds: Normal heart sounds.  Pulmonary:     Effort: Pulmonary effort is normal.     Breath sounds: Normal breath sounds.  Abdominal:     General: Bowel sounds are normal.     Palpations: Abdomen is soft.  Lymphadenopathy:     Cervical: No cervical adenopathy.  Skin:    General: Skin is warm and dry.     Capillary Refill: Capillary refill takes less than 2 seconds.  Neurological:     Mental Status: She is alert and oriented to person, place, and time.  Psychiatric:        Behavior: Behavior normal.      Musculoskeletal Exam: C-spine limited ROM with lateral rotation. Thoracic spine and lumbar spine good range of motion.  No midline spinal tenderness.  No SI joint tenderness.  Shoulder joints, elbow joints, wrist joints, MCPs, PIPs, DIPs have good range of motion.  Right wrist tenderness and inflammation noted. She has synovial thickening of PIP and DIP joints consistent with osteoarthritis. Tenderness and synovitis of right 3rd PIP joint.  Hip joints have good range of motion with discomfort.  She has painful range of motion of bilateral knee joints. Limited extension of both knee joints. No warmth or effusion was noted.  Ankle joints have good range of motion with no warmth or effusion.  No plantar fasciitis or Achilles tendinitis.  CDAI Exam: CDAI Score: 12  Patient Global: 5 mm; Provider Global: 5 mm Swollen: 2 ; Tender: 9  Joint Exam      Right  Left  Wrist  Swollen Tender     MCP 2   Tender   Tender  MCP 3   Tender     PIP 2   Tender   Tender  PIP 3  Swollen Tender     Knee   Tender   Tender     Investigation: No additional findings.  Imaging: No results found.  Recent Labs: Lab Results  Component Value Date   WBC 8.7 12/17/2018   HGB 12.7 12/17/2018   PLT 303 12/17/2018   NA 136  12/17/2018   K 3.9 12/17/2018   CL 101 12/17/2018   CO2 27 12/17/2018   GLUCOSE 71 12/17/2018   BUN 11 12/17/2018   CREATININE 0.91 12/17/2018   BILITOT 0.4 12/17/2018   ALKPHOS 83 04/05/2017   AST 13 12/17/2018   ALT 6 12/17/2018   PROT 6.9 12/17/2018   ALBUMIN 3.9 04/05/2017   CALCIUM 9.5 12/17/2018   GFRAA 80 12/17/2018    Speciality Comments: TB/HIV/Hep/SPEP/IG negative 03/02/16  Procedures:  No procedures performed Allergies: Azithromycin    Assessment / Plan:     Visit Diagnoses: Rheumatoid arthritis with rheumatoid factor of multiple sites without organ or systems involvement (HCC) - +RF, +ANA: She is currently having a rheumatoid arthritis flare.  She has tenderness and inflammation of the right wrist joint, right third PIP joint, bilateral knee joint  pain.  She has been off of methotrexate for the past one and half months due to the concern for COVID-19.  She would like to restart methotrexate 0.8 mL subcutaneous injections every week and folic acid 2 mg mouth daily.  Refill methotrexate was sent to the pharmacy.  She reports that she experiences nausea and diarrhea following her methotrexate injections so a refill of Zofran 4 mg by mouth every 8 hours as needed for nausea was sent to the pharmacy today.  A prednisone taper starting at 20 mg tapering by 5 mg every 4 days was sent to the pharmacy today.  She is advised to notify us or joint pain persists or worsens following the prednisone taper.  She will follow-up in the office in 5 months.  High risk medication use - Methotrexate 0.8 mL's every 7 days and folic acid 1 mg 2 tablets daily.  She experiences nausea and diarrhea following the methotrexate injections.  She requested a refill for Zofran.  Zofran 4 mg by mouth every 8 hours as needed for nausea recently with normalcy.  Most recent CBC/CMP within normal limits on 12/17/2018.  Due for CBC/CMP today and will monitor every 3 months.  She will be updating labs tomorrow.   Orders were released.  Standing orders are in place.  We discussed the importance of holding methotrexate if she develops any signs or symptoms of an infection and to resume once infection is cleared.  We discussed the standard precautions recommended by the CDC and importance of social distancing.  Plantar fasciitis - She had a right plantar fascia cortisone injection at her last visit 08/05/18.  Her symptoms have resolved.   Achilles tendinitis of right lower extremity - Resolved.   Primary osteoarthritis of both hands - She has PIP and DIP synovial thickening consistent with osteoarthritis of bilateral hands.  Joint protection and muscle strengthening were discussed.  Primary osteoarthritis of both knees - She has chronic pain both knee joints.  No warmth or effusion was noted today.  She has no mechanical symptoms.  She has not had any recent injuries or falls. She has a very physically demanding job. She has been off of MTX for 1.5 months, and she is having increased pain in both knee joints.  She uses Voltaren gel topically PRN for pain relief.  A prednisone taper was sent to the pharmacy.   Chondromalacia of both patellae - She has limited extension of the right knee joint.  She has bilateral knee joint crepitus.    Bilateral calcaneal spurs - Asymptomatic   DDD (degenerative disc disease), cervical - She has limited ROM with some discomfort.  She has no symptoms of radiculopathy at this time.  She declined a x-rays of the C-spine at this time.  She was given a handout of neck exercises.  She was advised to notify us if her neck pain or stiffness worsens.   Other medical conditions are listed as follows:   Flexion contracture of left knee   History of hypothyroidism   History of vitamin D deficiency   Smoker  History of depression   Orders: No orders of the defined types were placed in this encounter.  Meds ordered this encounter  Medications  . methotrexate 250 MG/10ML  injection    Sig: INJECT 0.8ML (20MG  TOTAL) INTO THE SKIN ONCE A WEEK    Dispense:  4 mL    Refill:  0  . ondansetron (ZOFRAN) 4 MG tablet    Sig: Take 1  tablet (4 mg total) by mouth every 8 (eight) hours as needed for nausea or vomiting.    Dispense:  20 tablet    Refill:  0  . predniSONE (DELTASONE) 5 MG tablet    Sig: Take 4 tablets by mouth daily x4 days, 3 tablets by mouth daily x4 days, 2 tablets by mouth daily x4 days, 1 tablet by mouth daily x4 days    Dispense:  40 tablet    Refill:  0    Face-to-face time spent with patient was 30 minutes. Greater than 50% of time was spent in counseling and coordination of care.  Follow-Up Instructions: Return in about 5 months (around 10/04/2019) for Rheumatoid arthritis, Osteoarthritis.   Ofilia Neas, PA-C  Note - This record has been created using Dragon software.  Chart creation errors have been sought, but may not always  have been located. Such creation errors do not reflect on  the standard of medical care.

## 2019-04-27 DIAGNOSIS — E6609 Other obesity due to excess calories: Secondary | ICD-10-CM | POA: Diagnosis not present

## 2019-04-27 DIAGNOSIS — Z1389 Encounter for screening for other disorder: Secondary | ICD-10-CM | POA: Diagnosis not present

## 2019-04-27 DIAGNOSIS — Z6832 Body mass index (BMI) 32.0-32.9, adult: Secondary | ICD-10-CM | POA: Diagnosis not present

## 2019-04-27 DIAGNOSIS — N342 Other urethritis: Secondary | ICD-10-CM | POA: Diagnosis not present

## 2019-05-04 ENCOUNTER — Other Ambulatory Visit: Payer: Self-pay

## 2019-05-04 ENCOUNTER — Ambulatory Visit (INDEPENDENT_AMBULATORY_CARE_PROVIDER_SITE_OTHER): Payer: BC Managed Care – PPO | Admitting: Physician Assistant

## 2019-05-04 ENCOUNTER — Encounter: Payer: Self-pay | Admitting: Physician Assistant

## 2019-05-04 VITALS — BP 151/85 | HR 74 | Resp 14 | Ht 64.0 in | Wt 195.6 lb

## 2019-05-04 DIAGNOSIS — M19041 Primary osteoarthritis, right hand: Secondary | ICD-10-CM

## 2019-05-04 DIAGNOSIS — Z8639 Personal history of other endocrine, nutritional and metabolic disease: Secondary | ICD-10-CM

## 2019-05-04 DIAGNOSIS — M2241 Chondromalacia patellae, right knee: Secondary | ICD-10-CM

## 2019-05-04 DIAGNOSIS — F17200 Nicotine dependence, unspecified, uncomplicated: Secondary | ICD-10-CM

## 2019-05-04 DIAGNOSIS — M722 Plantar fascial fibromatosis: Secondary | ICD-10-CM

## 2019-05-04 DIAGNOSIS — Z79899 Other long term (current) drug therapy: Secondary | ICD-10-CM | POA: Diagnosis not present

## 2019-05-04 DIAGNOSIS — M7731 Calcaneal spur, right foot: Secondary | ICD-10-CM

## 2019-05-04 DIAGNOSIS — M0579 Rheumatoid arthritis with rheumatoid factor of multiple sites without organ or systems involvement: Secondary | ICD-10-CM | POA: Diagnosis not present

## 2019-05-04 DIAGNOSIS — M24562 Contracture, left knee: Secondary | ICD-10-CM

## 2019-05-04 DIAGNOSIS — M7661 Achilles tendinitis, right leg: Secondary | ICD-10-CM | POA: Diagnosis not present

## 2019-05-04 DIAGNOSIS — M2242 Chondromalacia patellae, left knee: Secondary | ICD-10-CM

## 2019-05-04 DIAGNOSIS — Z8659 Personal history of other mental and behavioral disorders: Secondary | ICD-10-CM

## 2019-05-04 DIAGNOSIS — M170 Bilateral primary osteoarthritis of knee: Secondary | ICD-10-CM

## 2019-05-04 DIAGNOSIS — M19042 Primary osteoarthritis, left hand: Secondary | ICD-10-CM

## 2019-05-04 DIAGNOSIS — M7732 Calcaneal spur, left foot: Secondary | ICD-10-CM

## 2019-05-04 DIAGNOSIS — M5030 Other cervical disc degeneration, unspecified cervical region: Secondary | ICD-10-CM

## 2019-05-04 MED ORDER — PREDNISONE 5 MG PO TABS: ORAL_TABLET | ORAL | 0 refills | Status: AC

## 2019-05-04 MED ORDER — METHOTREXATE SODIUM CHEMO INJECTION 250 MG/10ML: INTRAMUSCULAR | 0 refills | Status: DC

## 2019-05-04 MED ORDER — ONDANSETRON HCL 4 MG PO TABS: 4.0000 mg | ORAL_TABLET | Freq: Three times a day (TID) | ORAL | 0 refills | Status: DC | PRN

## 2019-05-04 NOTE — Patient Instructions (Signed)
Standing Labs We placed an order today for your standing lab work.    Please come back and get your standing labs in September and every 3 months   We have open lab daily Monday through Thursday from 8:30-12:30 PM and 1:30-4:30 PM and Friday from 8:30-12:30 PM and 1:30 -4:00 PM at the office of Dr. Bo Merino.   You may experience shorter wait times on Monday and Friday afternoons. The office is located at [address] [place], [place], [place] [address] No appointment is necessary.   Labs are drawn by [facility].  You may receive a bill from [facility] for your lab work.  If you wish to have your labs drawn at another location, please call the office 24 hours in advance to send orders.  If you have any questions regarding directions or hours of operation,  please call [phone].   Just as a reminder please drink plenty of water prior to coming for your lab work. Thanks!    Neck Exercises Ask your health care provider which exercises are safe for you. Do exercises exactly as told by your health care provider and adjust them as directed. It is normal to feel mild stretching, pulling, tightness, or discomfort as you do these exercises. Stop right away if you feel sudden pain or your pain gets worse. Do not begin these exercises until told by your health care provider. Neck exercises can be important for many reasons. They can improve strength and maintain flexibility in your neck, which will help your upper back and prevent neck pain. Stretching exercises Rotation neck stretching  1. Sit in a chair or stand up. 2. Place your feet flat on the floor, shoulder width apart. 3. Slowly turn your head (rotate) to the right until a slight stretch is felt. Turn it all the way to the right so you can look over your right shoulder. Do not tilt or tip your head. 4. Hold this position for 10-30 seconds. 5. Slowly turn your head (rotate) to the left until a slight stretch is felt. Turn it all  the way to the left so you can look over your left shoulder. Do not tilt or tip your head. 6. Hold this position for 10-30 seconds. Repeat __________ times. Complete this exercise __________ times a day. Neck retraction 1. Sit in a sturdy chair or stand up. 2. Look straight ahead. Do not bend your neck. 3. Use your fingers to push your chin backward (retraction). Do not bend your neck for this movement. Continue to face straight ahead. If you are doing the exercise properly, you will feel a slight sensation in your throat and a stretch at the back of your neck. 4. Hold the stretch for 1-2 seconds. Repeat __________ times. Complete this exercise __________ times a day. Strengthening exercises Neck press 1. Lie on your back on a firm bed or on the floor with a pillow under your head. 2. Use your neck muscles to push your head down on the pillow and straighten your spine. 3. Hold the position as well as you can. Keep your head facing up (in a neutral position) and your chin tucked. 4. Slowly count to 5 while holding this position. Repeat __________ times. Complete this exercise __________ times a day. Isometrics These are exercises in which you strengthen the muscles in your neck while keeping your neck still (isometrics). 1. Sit in a supportive chair and place your hand on your forehead. 2. Keep your head and face facing straight  ahead. Do not flex or extend your neck while doing isometrics. 3. Push forward with your head and neck while pushing back with your hand. Hold for 10 seconds. 4. Do the sequence again, this time putting your hand against the back of your head. Use your head and neck to push backward against the hand pressure. 5. Finally, do the same exercise on either side of your head, pushing sideways against the pressure of your hand. Repeat __________ times. Complete this exercise __________ times a day. Prone head lifts 1. Lie face-down (prone position), resting on your elbows so  that your chest and upper back are raised. 2. Start with your head facing downward, near your chest. Position your chin either on or near your chest. 3. Slowly lift your head upward. Lift until you are looking straight ahead. Then continue lifting your head as far back as you can comfortably stretch. 4. Hold your head up for 5 seconds. Then slowly lower it to your starting position. Repeat __________ times. Complete this exercise __________ times a day. Supine head lifts 1. Lie on your back (supine position), bending your knees to point to the ceiling and keeping your feet flat on the floor. 2. Lift your head slowly off the floor, raising your chin toward your chest. 3. Hold for 5 seconds. Repeat __________ times. Complete this exercise __________ times a day. Scapular retraction 1. Stand with your arms at your sides. Look straight ahead. 2. Slowly pull both shoulders (scapulae) backward and downward (retraction) until you feel a stretch between your shoulder blades in your upper back. 3. Hold for 10-30 seconds. 4. Relax and repeat. Repeat __________ times. Complete this exercise __________ times a day. Contact a health care provider if:  Your neck pain or discomfort gets much worse when you do an exercise.  Your neck pain or discomfort does not improve within 2 hours after you exercise. If you have any of these problems, stop exercising right away. Do not do the exercises again unless your health care provider says that you can. Get help right away if:  You develop sudden, severe neck pain. If this happens, stop exercising right away. Do not do the exercises again unless your health care provider says that you can. This information is not intended to replace advice given to you by your health care provider. Make sure you discuss any questions you have with your health care provider. Document Released: 10/03/2015 Document Revised: 08/20/2018 Document Reviewed: 08/20/2018 Elsevier Patient  Education  2020 [employer].

## 2019-05-05 DIAGNOSIS — Z79899 Other long term (current) drug therapy: Secondary | ICD-10-CM | POA: Diagnosis not present

## 2019-05-06 LAB — COMPLETE METABOLIC PANEL WITH GFR
AG Ratio: 1.6 (calc) (ref 1.0–2.5)
ALT: 9 U/L (ref 6–29)
AST: 12 U/L (ref 10–35)
Albumin: 4.2 g/dL (ref 3.6–5.1)
Alkaline phosphatase (APISO): 94 U/L (ref 37–153)
BUN: 12 mg/dL (ref 7–25)
CO2: 26 mmol/L (ref 20–32)
Calcium: 9.5 mg/dL (ref 8.6–10.4)
Chloride: 103 mmol/L (ref 98–110)
Creat: 0.8 mg/dL (ref 0.50–1.05)
GFR, Est African American: 94 mL/min/{1.73_m2} (ref >=60)
GFR, Est Non African American: 81 mL/min/{1.73_m2} (ref >=60)
Globulin: 2.7 g/dL (calc) (ref 1.9–3.7)
Glucose, Bld: 94 mg/dL (ref 65–139)
Potassium: 4.1 mmol/L (ref 3.5–5.3)
Sodium: 137 mmol/L (ref 135–146)
Total Bilirubin: 0.4 mg/dL (ref 0.2–1.2)
Total Protein: 6.9 g/dL (ref 6.1–8.1)

## 2019-05-06 LAB — CBC WITH DIFFERENTIAL/PLATELET
Absolute Monocytes: 421 cells/uL (ref 200–950)
Basophils Absolute: 52 cells/uL (ref 0–200)
Basophils Relative: 0.6 %
Eosinophils Absolute: 181 cells/uL (ref 15–500)
Eosinophils Relative: 2.1 %
HCT: 40.5 % (ref 35.0–45.0)
Hemoglobin: 13.9 g/dL (ref 11.7–15.5)
Lymphs Abs: 2253 cells/uL (ref 850–3900)
MCH: 31.5 pg (ref 27.0–33.0)
MCHC: 34.3 g/dL (ref 32.0–36.0)
MCV: 91.8 fL (ref 80.0–100.0)
MPV: 9.7 fL (ref 7.5–12.5)
Monocytes Relative: 4.9 %
Neutro Abs: 5693 cells/uL (ref 1500–7800)
Neutrophils Relative %: 66.2 %
Platelets: 304 10*3/uL (ref 140–400)
RBC: 4.41 10*6/uL (ref 3.80–5.10)
RDW: 11.8 % (ref 11.0–15.0)
Total Lymphocyte: 26.2 %
WBC: 8.6 10*3/uL (ref 3.8–10.8)

## 2019-05-06 NOTE — Progress Notes (Signed)
Within normal limits

## 2019-06-09 ENCOUNTER — Other Ambulatory Visit: Payer: Self-pay | Admitting: Physician Assistant

## 2019-06-09 ENCOUNTER — Other Ambulatory Visit (HOSPITAL_COMMUNITY): Payer: Self-pay | Admitting: Physician Assistant

## 2019-06-09 DIAGNOSIS — Z6831 Body mass index (BMI) 31.0-31.9, adult: Secondary | ICD-10-CM | POA: Diagnosis not present

## 2019-06-09 DIAGNOSIS — R1013 Epigastric pain: Secondary | ICD-10-CM | POA: Diagnosis not present

## 2019-06-09 DIAGNOSIS — R1011 Right upper quadrant pain: Secondary | ICD-10-CM

## 2019-06-09 DIAGNOSIS — Z1389 Encounter for screening for other disorder: Secondary | ICD-10-CM | POA: Diagnosis not present

## 2019-06-09 NOTE — Telephone Encounter (Signed)
Last Visit: 05/04/19 Next Visit: 10/13/19 Labs: 05/05/19 WNL   Okay to refill per Dr. Estanislado Pandy

## 2019-06-12 ENCOUNTER — Other Ambulatory Visit: Payer: Self-pay

## 2019-06-12 ENCOUNTER — Ambulatory Visit (HOSPITAL_COMMUNITY)
Admission: RE | Admit: 2019-06-12 | Discharge: 2019-06-12 | Disposition: A | Discharge status: Home / Self Care | Payer: BC Managed Care – PPO | Source: Ambulatory Visit | Attending: Physician Assistant | Admitting: Physician Assistant

## 2019-06-12 DIAGNOSIS — R1013 Epigastric pain: Secondary | ICD-10-CM | POA: Diagnosis not present

## 2019-06-12 DIAGNOSIS — R1011 Right upper quadrant pain: Secondary | ICD-10-CM | POA: Diagnosis not present

## 2019-06-16 ENCOUNTER — Other Ambulatory Visit: Payer: BLUE CROSS/BLUE SHIELD | Admitting: Adult Health

## 2019-06-17 ENCOUNTER — Other Ambulatory Visit: Payer: BLUE CROSS/BLUE SHIELD | Admitting: Adult Health

## 2019-06-25 ENCOUNTER — Other Ambulatory Visit (HOSPITAL_COMMUNITY): Payer: Self-pay | Admitting: Physician Assistant

## 2019-06-25 DIAGNOSIS — R198 Other specified symptoms and signs involving the digestive system and abdomen: Secondary | ICD-10-CM

## 2019-06-26 ENCOUNTER — Other Ambulatory Visit: Payer: Self-pay

## 2019-06-26 ENCOUNTER — Emergency Department (HOSPITAL_COMMUNITY): Payer: BC Managed Care – PPO

## 2019-06-26 ENCOUNTER — Emergency Department (HOSPITAL_COMMUNITY)
Admission: EM | Admit: 2019-06-26 | Discharge: 2019-06-26 | Disposition: A | Discharge status: Home / Self Care | Payer: BC Managed Care – PPO | Attending: Emergency Medicine | Admitting: Emergency Medicine

## 2019-06-26 ENCOUNTER — Encounter (HOSPITAL_COMMUNITY): Payer: Self-pay | Admitting: Emergency Medicine

## 2019-06-26 DIAGNOSIS — R1011 Right upper quadrant pain: Secondary | ICD-10-CM | POA: Diagnosis not present

## 2019-06-26 DIAGNOSIS — F17210 Nicotine dependence, cigarettes, uncomplicated: Secondary | ICD-10-CM | POA: Diagnosis not present

## 2019-06-26 DIAGNOSIS — R1010 Upper abdominal pain, unspecified: Secondary | ICD-10-CM

## 2019-06-26 DIAGNOSIS — Z20828 Contact with and (suspected) exposure to other viral communicable diseases: Secondary | ICD-10-CM | POA: Diagnosis not present

## 2019-06-26 DIAGNOSIS — R109 Unspecified abdominal pain: Secondary | ICD-10-CM

## 2019-06-26 DIAGNOSIS — K828 Other specified diseases of gallbladder: Secondary | ICD-10-CM | POA: Diagnosis not present

## 2019-06-26 DIAGNOSIS — Z03818 Encounter for observation for suspected exposure to other biological agents ruled out: Secondary | ICD-10-CM | POA: Diagnosis not present

## 2019-06-26 DIAGNOSIS — R1013 Epigastric pain: Secondary | ICD-10-CM | POA: Diagnosis not present

## 2019-06-26 HISTORY — DX: Calculus of gallbladder without cholecystitis without obstruction: K80.20

## 2019-06-26 LAB — SARS CORONAVIRUS 2 BY RT PCR (HOSPITAL ORDER, PERFORMED IN ~~LOC~~ HOSPITAL LAB): SARS Coronavirus 2: NEGATIVE

## 2019-06-26 LAB — CBC
HCT: 43.5 % (ref 36.0–46.0)
Hemoglobin: 14.3 g/dL (ref 12.0–15.0)
MCH: 30.8 pg (ref 26.0–34.0)
MCHC: 32.9 g/dL (ref 30.0–36.0)
MCV: 93.5 fL (ref 80.0–100.0)
Platelets: 289 10*3/uL (ref 150–400)
RBC: 4.65 MIL/uL (ref 3.87–5.11)
RDW: 12.5 % (ref 11.5–15.5)
WBC: 8.4 10*3/uL (ref 4.0–10.5)
nRBC: 0 % (ref 0.0–0.2)

## 2019-06-26 LAB — LIPASE, BLOOD: Lipase: 46 U/L (ref 11–51)

## 2019-06-26 LAB — DIFFERENTIAL
Basophils Absolute: 0 10*3/uL (ref 0.0–0.1)
Basophils Relative: 0 %
Eosinophils Absolute: 0.2 10*3/uL (ref 0.0–0.5)
Eosinophils Relative: 3 %
Lymphocytes Relative: 37 %
Lymphs Abs: 3.1 10*3/uL (ref 0.7–4.0)
Monocytes Absolute: 0.4 10*3/uL (ref 0.1–1.0)
Monocytes Relative: 5 %
Neutro Abs: 4.6 10*3/uL (ref 1.7–7.7)
Neutrophils Relative %: 55 %

## 2019-06-26 LAB — COMPREHENSIVE METABOLIC PANEL
ALT: 15 U/L (ref 0–44)
AST: 32 U/L (ref 15–41)
Albumin: 3.7 g/dL (ref 3.5–5.0)
Alkaline Phosphatase: 97 U/L (ref 38–126)
Anion gap: 11 (ref 5–15)
BUN: 10 mg/dL (ref 6–20)
CO2: 24 mmol/L (ref 22–32)
Calcium: 9.3 mg/dL (ref 8.9–10.3)
Chloride: 105 mmol/L (ref 98–111)
Creatinine, Ser: 0.86 mg/dL (ref 0.44–1.00)
GFR calc Af Amer: >60 mL/min (ref >60)
GFR calc non Af Amer: >60 mL/min (ref >60)
Glucose, Bld: 172 mg/dL — ABNORMAL HIGH (ref 70–99)
Potassium: 3.4 mmol/L — ABNORMAL LOW (ref 3.5–5.1)
Sodium: 140 mmol/L (ref 135–145)
Total Bilirubin: 0.5 mg/dL (ref 0.3–1.2)
Total Protein: 7 g/dL (ref 6.5–8.1)

## 2019-06-26 MED ORDER — HYDROCODONE-ACETAMINOPHEN 5-325 MG PO TABS: 1.0000 | ORAL_TABLET | Freq: Four times a day (QID) | ORAL | 0 refills | Status: DC | PRN

## 2019-06-26 MED ORDER — HYDROMORPHONE HCL 1 MG/ML IJ SOLN
1.0000 mg | Freq: Once | INTRAMUSCULAR | Status: AC
  Administered 2019-06-26: 12:00:00 1 mg via INTRAVENOUS
  Filled 2019-06-26: qty 1

## 2019-06-26 MED ORDER — SODIUM CHLORIDE 0.9 % IV BOLUS
500.0000 mL | Freq: Once | INTRAVENOUS | Status: AC
  Administered 2019-06-26: 12:00:00 500 mL via INTRAVENOUS

## 2019-06-26 MED ORDER — FAMOTIDINE 20 MG PO TABS: 20.0000 mg | ORAL_TABLET | Freq: Two times a day (BID) | ORAL | 0 refills | Status: AC

## 2019-06-26 MED ORDER — ONDANSETRON 4 MG PO TBDP: ORAL_TABLET | ORAL | 0 refills | Status: DC

## 2019-06-26 MED ORDER — ONDANSETRON HCL 4 MG/2ML IJ SOLN
4.0000 mg | Freq: Once | INTRAMUSCULAR | Status: AC
  Administered 2019-06-26: 4 mg via INTRAVENOUS
  Filled 2019-06-26: qty 2

## 2019-06-26 NOTE — ED Triage Notes (Signed)
Pt c/o sudden onset RT upper abdominal pain that began around 20 minutes ago that radiates into back. Pt recently diagnosed with gallstones. Pt with tachypnea and is diaphoretic. Denies n/v/d.

## 2019-06-26 NOTE — Discharge Instructions (Addendum)
Follow-up Monday as planned return if any problems

## 2019-06-26 NOTE — ED Provider Notes (Signed)
[facility] EMERGENCY DEPARTMENT Provider Note   CSN: [id number] Arrival date & time: 06/26/19  1115     History   Chief Complaint Chief Complaint  Patient presents with  . Abdominal Pain    HPI Andrea Ayers is a 58 y.o. female.     Patient complains of right upper quadrant abdominal pain radiating into her back.  She is scheduled to get a HIDA scan Monday  The history is provided by the patient. No language interpreter was used.  Abdominal Pain Pain location:  Epigastric Pain quality: aching   Pain radiation: Back. Pain severity:  Moderate Onset quality:  Sudden Timing:  Constant Progression:  Waxing and waning Chronicity:  New Context: not alcohol use   Associated symptoms: no chest pain, no cough, no diarrhea, no fatigue and no hematuria     Past Medical History:  Diagnosis Date  . Gallstones   . Rheumatoid arthritis [facility])     Patient Active Problem List   Diagnosis Date Noted  . Primary osteoarthritis of both knees 04/30/2017  . ANA positive 04/30/2017  . High risk medication use 04/26/2017  . Plantar fasciitis 04/26/2017  . Flexion contracture of left knee 04/26/2017  . DDD (degenerative disc disease), cervical 04/26/2017  . Bilateral calcaneal spurs 04/26/2017  . Primary osteoarthritis of both hands 04/26/2017  . History of hypothyroidism 04/26/2017  . Vitamin D deficiency 04/26/2017  . Smoker 04/26/2017  . Chondromalacia of both patellae 04/26/2017  . Rheumatoid arthritis with rheumatoid factor of multiple sites without organ or systems involvement (HCC) 10/05/2016    History reviewed. No pertinent surgical history.   OB History   No obstetric history on file.      Home Medications    Prior to Admission medications   Medication Sig Start Date End Date Taking? Authorizing Provider  Acetaminophen (TYLENOL ARTHRITIS PAIN PO) Take by mouth daily.     [provider]  famotidine (PEPCID) 20 MG tablet Take 1 tablet (20 mg total)  by mouth 2 (two) times daily. 06/26/19   Bethann Berkshire, MD  folic acid (FOLVITE) 1 MG tablet TAKE 2 TABLETS (2 MG TOTAL) BY MOUTH DAILY. 12/05/18 02/28/20  Pollyann Savoy, MD  gabapentin (NEURONTIN) 100 MG capsule Take 100 mg by mouth 3 (three) times daily.    [provider]  HYDROcodone-acetaminophen (NORCO/VICODIN) 5-325 MG tablet Take 1 tablet by mouth every 6 (six) hours as needed. 06/26/19   Bethann Berkshire, MD  levothyroxine (SYNTHROID, LEVOTHROID) 175 MCG tablet  03/02/17   [provider]  methotrexate 50 MG/2ML injection INJECT 0.8 ML (20 MG TOTAL) INTO SKIN ONCE A WEEK 06/09/19   Pollyann Savoy, MD  ondansetron (ZOFRAN ODT) 4 MG disintegrating tablet 4mg  ODT q4 hours prn nausea/vomit 06/26/19   06/28/19, MD  ondansetron (ZOFRAN) 4 MG tablet Take 1 tablet (4 mg total) by mouth every 8 (eight) hours as needed for nausea or vomiting. 05/04/19   05/06/19, PA-C  predniSONE (DELTASONE) 5 MG tablet Take 4 tablets by mouth daily x4 days, 3 tablets by mouth daily x4 days, 2 tablets by mouth daily x4 days, 1 tablet by mouth daily x4 days 05/04/19   05/06/19, PA-C  Tuberculin-Allergy Syringes 27G X 1/2" 1 ML MISC Patient to use to inject Methotrexate once weekly. 07/31/18   08/02/18, MD    Family History Family History  Problem Relation Age of Onset  . Rheum arthritis Mother   . Thyroid disease Father   .  Fibromyalgia Sister   . Asthma Brother   . Healthy Son     Social History Social History   Tobacco Use  . Smoking status: Current Some Day Smoker    Packs/day: 0.50    Types: Cigarettes  . Smokeless tobacco: Never Used  Substance Use Topics  . Alcohol use: No  . Drug use: No     Allergies   Azithromycin   Review of Systems Review of Systems  Constitutional: Negative for appetite change and fatigue.  HENT: Negative for congestion, ear discharge and sinus pressure.   Eyes: Negative for discharge.  Respiratory: Negative for cough.    Cardiovascular: Negative for chest pain.  Gastrointestinal: Positive for abdominal pain. Negative for diarrhea.  Genitourinary: Negative for frequency and hematuria.  Musculoskeletal: Negative for back pain.  Skin: Negative for rash.  Neurological: Negative for seizures and headaches.  Psychiatric/Behavioral: Negative for hallucinations.     Physical Exam Updated Vital Signs BP (!) 150/104 (BP Location: Right Arm)   Pulse (!) 110   Temp 97.8 F (36.6 C) (Oral)   Resp (!) 22   Ht 5\' 4"  (1.626 m)   Wt 86.2 kg   SpO2 100%   BMI 32.61 kg/m   Physical Exam Vitals signs and nursing note reviewed.  Constitutional:      Appearance: She is well-developed.  HENT:     Head: Normocephalic.     Nose: Nose normal.  Eyes:     General: No scleral icterus.    Conjunctiva/sclera: Conjunctivae normal.  Neck:     Musculoskeletal: Neck supple.     Thyroid: No thyromegaly.  Cardiovascular:     Rate and Rhythm: Normal rate and regular rhythm.     Heart sounds: No murmur. No friction rub. No gallop.   Pulmonary:     Breath sounds: No stridor. No wheezing or rales.  Chest:     Chest wall: No tenderness.  Abdominal:     General: There is no distension.     Tenderness: There is abdominal tenderness. There is no rebound.  Musculoskeletal: Normal range of motion.  Lymphadenopathy:     Cervical: No cervical adenopathy.  Skin:    Findings: No erythema or rash.  Neurological:     Mental Status: She is oriented to person, place, and time.     Motor: No abnormal muscle tone.     Coordination: Coordination normal.  Psychiatric:        Behavior: Behavior normal.      ED Treatments / Results  Labs (all labs ordered are listed, but only abnormal results are displayed) Labs Reviewed  COMPREHENSIVE METABOLIC PANEL - Abnormal; Notable for the following components:      Result Value   Potassium 3.4 (*)    Glucose, Bld 172 (*)    All other components within normal limits  SARS CORONAVIRUS  2 (HOSPITAL ORDER, [place] [facility])  LIPASE, BLOOD  CBC  DIFFERENTIAL  URINALYSIS, ROUTINE W REFLEX MICROSCOPIC    EKG None  Radiology US Abdomen Limited Ruq  Result Date: 06/26/2019 CLINICAL DATA:  Severe RIGHT UPPER QUADRANT pain with nausea. Sudden onset of symptoms. EXAM: ULTRASOUND ABDOMEN LIMITED RIGHT UPPER QUADRANT COMPARISON:  Abdominal ultrasound on 06/12/2019 FINDINGS: Gallbladder: Gallbladder has a normal appearance. Gallbladder wall is 5.0 millimeters, within normal limits. No stones or pericholecystic fluid. No sonographic Murphy's sign. The appearance of the gallbladder stable compared recent abdominal ultrasound. Common bile duct: Diameter: 3.0 milliliters Liver: No focal liver lesion. The  liver is mildly echogenic, stable in appearance. Portal vein is patent on color Doppler imaging with normal direction of blood flow towards the liver. Other: None. IMPRESSION: 1. Nonspecific gallbladder wall thickening without sonographic Murphy sign or stones. 2. Mildly echogenic liver, consistent with mild hepatic steatosis. No focal liver lesion. Electronically Signed   By: Norva Pavlov M.D.   On: 06/26/2019 13:01    Procedures Procedures (including critical care time)  Medications Ordered in ED Medications  HYDROmorphone (DILAUDID) injection 1 mg (1 mg Intravenous Given 06/26/19 1158)  ondansetron (ZOFRAN) injection 4 mg (4 mg Intravenous Given 06/26/19 1158)  sodium chloride 0.9 % bolus 500 mL (500 mLs Intravenous New Bag/Given 06/26/19 1159)     Initial Impression / Assessment and Plan / ED Course  I have reviewed the triage vital signs and the nursing notes.  Pertinent labs & imaging results that were available during my care of the patient were reviewed by me and considered in my medical decision making (see chart for details).   Patient improved with pain medicine.  Labs unremarkable.  Ultrasound does not show cholecystitis.  Patient is sent home on  Vicodin Zofran and Pepcid and will follow-up Monday for her HIDA scan      Final Clinical Impressions(s) / ED Diagnoses   Final diagnoses:  Pain of upper abdomen    ED Discharge Orders         Ordered    ondansetron (ZOFRAN ODT) 4 MG disintegrating tablet     06/26/19 1346    HYDROcodone-acetaminophen (NORCO/VICODIN) 5-325 MG tablet  Every 6 hours PRN     06/26/19 1346    famotidine (PEPCID) 20 MG tablet  2 times daily     06/26/19 1347           Bethann Berkshire, MD 06/26/19 1355

## 2019-06-27 LAB — CBC WITH DIFFERENTIAL
Hematocrit: 30.1 % — ABNORMAL LOW (ref 34.0–46.0)
Hemoglobin: 10.3 g/dL — ABNORMAL LOW (ref 11.5–15.0)
MCV: 83 fL — NL (ref 80–100)
Platelets count: 220 10*3/uL — NL (ref 140–400)
RDW, RBC: 12 % — NL (ref 12.0–16.5)
Red blood cells count: 3.61 M/uL — NL (ref 3.60–5.70)
WBC Count: 11 10*3/uL — NL (ref 3.7–11.1)

## 2019-06-27 LAB — WBC AUTO DIFF (OUTSIDE ORGANIZATION)
Basophils % Auto: 0 % — NL (ref 0–1)
Eosinophils % Auto: 0 % — NL (ref 0–7)
Immature Granulocytes % Auto: 0 % — NL (ref 0–1)
Lymphocytes % Auto: 10 % — ABNORMAL LOW (ref 15–47)
Monocytes % Auto: 4 % — ABNORMAL LOW (ref 5–13)
Neutrophils % Auto: 86 % — ABNORMAL HIGH (ref 42–76)
Neutrophils, Automated Count: 9.4 10*3/uL — ABNORMAL HIGH (ref 1.8–7.9)

## 2019-06-27 LAB — CHEM 7 (NA, K, CL, CO2, BUN, GLUC, CR) (OUTSIDE ORGANIZATION)
Anion Gap: 12 meq/L — NL (ref 5–16)
CO2: 21 meq/L — ABNORMAL LOW (ref 24–33)
Chloride: 95 meq/L — ABNORMAL LOW (ref 100–111)
Glucose: 610 mg/dL — CR (ref 60–159)
Potassium: 3.8 meq/L — NL (ref 3.5–5.3)
Sodium: 128 meq/L — ABNORMAL LOW (ref 135–145)
Urea Nitrogen, Blood (BUN): 24 mg/dL — NL (ref 7–27)

## 2019-06-28 LAB — PHOSPHORUS (PO4): Phosphorus (PO4): 3.9 mg/dL — NL (ref 2.7–4.5)

## 2019-06-28 LAB — WBC AUTO DIFF (OUTSIDE ORGANIZATION)
Basophils % Auto: 0 % — NL (ref 0–1)
Eosinophils % Auto: 0 % — NL (ref 0–7)
Immature Granulocytes % Auto: 0 % — NL (ref 0–1)
Lymphocytes % Auto: 24 % — NL (ref 15–47)
Monocytes % Auto: 8 % — NL (ref 5–13)
Neutrophils % Auto: 67 % — NL (ref 42–76)
Neutrophils, Automated Count: 5.1 10*3/uL — NL (ref 1.8–7.9)

## 2019-06-28 LAB — CHEM 7 (NA, K, CL, CO2, BUN, GLUC, CR) (OUTSIDE ORGANIZATION)
Anion Gap: 9 meq/L — NL (ref 5–16)
CO2: 25 meq/L — NL (ref 24–33)
Chloride: 104 meq/L — NL (ref 100–111)
Glucose: 146 mg/dL — NL (ref 60–159)
Potassium: 3.1 meq/L — ABNORMAL LOW (ref 3.5–5.3)
Sodium: 138 meq/L — NL (ref 135–145)
Urea Nitrogen, Blood (BUN): 32 mg/dL — ABNORMAL HIGH (ref 7–27)

## 2019-06-28 LAB — CBC WITH DIFFERENTIAL
Hematocrit: 26.6 % — ABNORMAL LOW (ref 34.0–46.0)
Hemoglobin: 8.9 g/dL — ABNORMAL LOW (ref 11.5–15.0)
MCV: 84 fL — NL (ref 80–100)
Platelets count: 233 10*3/uL — NL (ref 140–400)
RDW, RBC: 12.8 % — NL (ref 12.0–16.5)
Red blood cells count: 3.15 M/uL — ABNORMAL LOW (ref 3.60–5.70)
WBC Count: 7.5 10*3/uL — NL (ref 3.7–11.1)

## 2019-06-28 LAB — CALCIUM: Calcium: 8.7 mg/dL — ABNORMAL LOW (ref 8.8–10.5)

## 2019-06-28 LAB — MAGNESIUM (MG): Magnesium (Mg): 1.5 mg/dL — ABNORMAL LOW (ref 1.7–2.3)

## 2019-06-29 ENCOUNTER — Other Ambulatory Visit (HOSPITAL_COMMUNITY): Payer: Self-pay | Admitting: Physician Assistant

## 2019-06-29 ENCOUNTER — Encounter (HOSPITAL_COMMUNITY)
Admission: RE | Admit: 2019-06-29 | Discharge: 2019-06-29 | Disposition: A | Discharge status: Home / Self Care | Payer: BC Managed Care – PPO | Source: Ambulatory Visit | Attending: Physician Assistant | Admitting: Physician Assistant

## 2019-06-29 ENCOUNTER — Other Ambulatory Visit: Payer: Self-pay

## 2019-06-29 DIAGNOSIS — R198 Other specified symptoms and signs involving the digestive system and abdomen: Secondary | ICD-10-CM

## 2019-06-29 DIAGNOSIS — R1011 Right upper quadrant pain: Secondary | ICD-10-CM | POA: Diagnosis not present

## 2019-06-29 DIAGNOSIS — R112 Nausea with vomiting, unspecified: Secondary | ICD-10-CM | POA: Diagnosis not present

## 2019-06-29 LAB — CHEM 7 (NA, K, CL, CO2, BUN, GLUC, CR) (OUTSIDE ORGANIZATION)
Anion Gap: 7 meq/L — NL (ref 5–16)
CO2: 26 meq/L — NL (ref 24–33)
Chloride: 105 meq/L — NL (ref 100–111)
Glucose: 92 mg/dL — NL (ref 60–159)
Potassium: 3.5 meq/L — NL (ref 3.5–5.3)
Sodium: 138 meq/L — NL (ref 135–145)
Urea Nitrogen, Blood (BUN): 27 mg/dL — NL (ref 7–27)

## 2019-06-29 LAB — CBC WITH DIFFERENTIAL
Hematocrit: 29.1 % — ABNORMAL LOW (ref 34.0–46.0)
Hemoglobin: 9.6 g/dL — ABNORMAL LOW (ref 11.5–15.0)
MCV: 86 fL — NL (ref 80–100)
Platelets count: 248 10*3/uL — NL (ref 140–400)
RDW, RBC: 12.8 % — NL (ref 12.0–16.5)
Red blood cells count: 3.39 M/uL — ABNORMAL LOW (ref 3.60–5.70)
WBC Count: 6.4 10*3/uL — NL (ref 3.7–11.1)

## 2019-06-29 LAB — CALCIUM: Calcium: 8.4 mg/dL — ABNORMAL LOW (ref 8.8–10.5)

## 2019-06-29 LAB — WBC AUTO DIFF (OUTSIDE ORGANIZATION)
Basophils % Auto: 0 % — NL (ref 0–1)
Eosinophils % Auto: 0 % — NL (ref 0–7)
Immature Granulocytes % Auto: 0 % — NL (ref 0–1)
Lymphocytes % Auto: 34 % — NL (ref 15–47)
Monocytes % Auto: 6 % — NL (ref 5–13)
Neutrophils % Auto: 59 % — NL (ref 42–76)
Neutrophils, Automated Count: 3.8 10*3/uL — NL (ref 1.8–7.9)

## 2019-06-29 LAB — MAGNESIUM (MG): Magnesium (Mg): 2.1 mg/dL — NL (ref 1.7–2.3)

## 2019-06-29 LAB — PHOSPHORUS (PO4): Phosphorus (PO4): 3.9 mg/dL — NL (ref 2.7–4.5)

## 2019-06-29 MED ORDER — MORPHINE SULFATE (PF) 4 MG/ML IV SOLN
3.5000 mg | Freq: Once | INTRAVENOUS | Status: AC
  Administered 2019-06-29: 13:00:00 3.5 mg via INTRAVENOUS
  Filled 2019-06-29: qty 0.9

## 2019-06-29 MED ORDER — MORPHINE SULFATE (PF) 4 MG/ML IV SOLN
INTRAVENOUS | Status: AC
  Administered 2019-06-29: 3.5 mg via INTRAVENOUS
  Filled 2019-06-29: qty 1

## 2019-06-29 MED ORDER — TECHNETIUM TC 99M MEBROFENIN IV KIT
5.0000 | PACK | Freq: Once | INTRAVENOUS | Status: AC | PRN
  Administered 2019-06-29: 4.7 via INTRAVENOUS

## 2019-07-04 ENCOUNTER — Other Ambulatory Visit: Payer: Self-pay | Admitting: Rheumatology

## 2019-07-14 ENCOUNTER — Other Ambulatory Visit: Payer: Self-pay

## 2019-07-14 ENCOUNTER — Encounter: Payer: Self-pay | Admitting: General Surgery

## 2019-07-14 ENCOUNTER — Ambulatory Visit: Payer: BC Managed Care – PPO | Admitting: General Surgery

## 2019-07-14 VITALS — BP 150/86 | HR 90 | Temp 97.5°F | Resp 16 | Ht 64.0 in | Wt 189.0 lb

## 2019-07-14 DIAGNOSIS — K811 Chronic cholecystitis: Secondary | ICD-10-CM | POA: Insufficient documentation

## 2019-07-14 NOTE — Patient Instructions (Signed)
Cholecystitis  Cholecystitis is inflammation of the gallbladder. It is often called a gallbladder attack. The gallbladder is a pear-shaped organ that lies beneath the liver on the right side of the body. The gallbladder stores bile, which is a fluid that helps the body digest fats. If bile builds up in your gallbladder, your gallbladder becomes inflamed. This condition may occur suddenly. Cholecystitis is a serious condition and requires treatment. What are the causes? The most common cause of this condition is gallstones. Gallstones can block the tube (duct) that carries bile out of your gallbladder. This causes bile to build up. Other causes include:  Damage to the gallbladder due to a decrease in blood flow.  Infections in the bile ducts.  Scars or kinks in the bile ducts.  Tumors in the liver, pancreas, or gallbladder. What increases the risk? You are more likely to develop this condition if:  You have sickle cell disease.  You take birth control pills or use estrogen.  You have alcoholic liver disease.  You have liver cirrhosis.  You have your nutrition delivered through a vein (parenteral nutrition).  You are critically ill.  You do not eat or drink for a long time. This is also called "fasting."  You are obese.  You lose weight too fast.  You are pregnant.  You have high levels of fat (triglycerides) in the blood.  You have pancreatitis. What are the signs or symptoms? Symptoms of this condition include:  Pain in the abdomen, especially in the upper right area of the abdomen.  Tenderness or bloating in the abdomen.  Nausea.  Vomiting.  Fever.  Chills. How is this diagnosed? This condition is diagnosed with a medical history and physical exam. You may also have other tests, including:  Imaging tests, such as: ? An ultrasound of the gallbladder. ? A CT scan of the abdomen. ? A gallbladder nuclear scan (HIDA scan). This scan allows your health care  provider to see the bile moving from your liver to your gallbladder and on to your small intestine. ? MRI.  Blood tests, such as: ? A complete blood count. The white blood cell count may be higher than normal. ? Liver function tests. Certain types of gallstones cause some results to be higher than normal. How is this treated? Treatment may include:  Surgery to remove your gallbladder (cholecystectomy).  Antibiotic medicine, usually through an IV.  Fasting for a certain amount of time.  Giving IV fluids.  Medicine to treat pain or vomiting. Follow these instructions at home:  If you had surgery, follow instructions from your health care provider about home care after the procedure. Medicines   Take over-the-counter and prescription medicines only as told by your health care provider.  If you were prescribed an antibiotic medicine, take it as told by your health care provider. Do not stop taking the antibiotic even if you start to feel better. General instructions  Follow instructions from your health care provider about what to eat or drink. When you are allowed to eat, avoid eating or drinking anything that triggers your symptoms.  Do not lift anything that is heavier than 10 lb (4.5 kg), or the limit that you are told, until your health care provider says that it is safe.  Do not use any products that contain nicotine or tobacco, such as cigarettes and e-cigarettes. If you need help quitting, ask your health care provider.  Keep all follow-up visits as told by your health care provider. This is   important. Contact a health care provider if:  Your pain is not controlled with medicine.  You have a fever. Get help right away if:  Your pain moves to another part of your abdomen or to your back.  You continue to have symptoms or you develop new symptoms even with treatment. Summary  Cholecystitis is inflammation of the gallbladder.  The most common cause of this condition  is gallstones. Gallstones can block the tube (duct) that carries bile out of your gallbladder.  Common symptoms are pain in the abdomen, nausea, vomiting, fever, and chills.  This condition is treated with surgery to remove the gallbladder, medicines, fasting, and IV fluids.  Follow your health care provider's instructions for eating and drinking. Avoid eating anything that triggers your symptoms. This information is not intended to replace advice given to you by your health care provider. Make sure you discuss any questions you have with your health care provider. Document Released: 10/22/2005 Document Revised: 02/28/2018 Document Reviewed: 02/28/2018 Elsevier Patient Education  2020 [employer]   Laparoscopic Cholecystectomy Laparoscopic cholecystectomy is surgery to remove the gallbladder. The gallbladder is a pear-shaped organ that lies beneath the liver on the right side of the body. The gallbladder stores bile, which is a fluid that helps the body to digest fats. Cholecystectomy is often done for inflammation of the gallbladder (cholecystitis). This condition is usually caused by a buildup of gallstones (cholelithiasis) in the gallbladder. Gallstones can block the flow of bile, which can result in inflammation and pain. In severe cases, emergency surgery may be required. This procedure is done though small incisions in your abdomen (laparoscopic surgery). A thin scope with a camera (laparoscope) is inserted through one incision. Thin surgical instruments are inserted through the other incisions. In some cases, a laparoscopic procedure may be turned into a type of surgery that is done through a larger incision (open surgery). Tell a health care provider about:  Any allergies you have.  All medicines you are taking, including vitamins, herbs, eye drops, creams, and over-the-counter medicines.  Any problems you or family members have had with anesthetic medicines.  Any blood disorders  you have.  Any surgeries you have had.  Any medical conditions you have.  Whether you are pregnant or may be pregnant. What are the risks? Generally, this is a safe procedure. However, problems may occur, including:  Infection.  Bleeding.  Allergic reactions to medicines.  Damage to other structures or organs.  A stone remaining in the common bile duct. The common bile duct carries bile from the gallbladder into the small intestine.  A bile leak from the cyst duct that is clipped when your gallbladder is removed. Medicines  Ask your health care provider about: ? Changing or stopping your regular medicines. This is especially important if you are taking diabetes medicines or blood thinners. ? Taking medicines such as aspirin and ibuprofen. These medicines can thin your blood. Do not take these medicines before your procedure if your health care provider instructs you not to.  You may be given antibiotic medicine to help prevent infection. General instructions  Let your health care provider know if you develop a cold or an infection before surgery.  Plan to have someone take you home from the hospital or clinic.  Ask your health care provider how your surgical site will be marked or identified. What happens during the procedure?   To reduce your risk of infection: ? Your health care team will wash or sanitize their hands. ?   Your skin will be washed with soap. ? Hair may be removed from the surgical area.  An IV tube may be inserted into one of your veins.  You will be given one or more of the following: ? A medicine to help you relax (sedative). ? A medicine to make you fall asleep (general anesthetic).  A breathing tube will be placed in your mouth.  Your surgeon will make several small cuts (incisions) in your abdomen.  The laparoscope will be inserted through one of the small incisions. The camera on the laparoscope will send images to a TV screen (monitor) in  the operating room. This lets your surgeon see inside your abdomen.  Air-like gas will be pumped into your abdomen. This will expand your abdomen to give the surgeon more room to perform the surgery.  Other tools that are needed for the procedure will be inserted through the other incisions. The gallbladder will be removed through one of the incisions.  Your common bile duct may be examined. If stones are found in the common bile duct, they may be removed.  After your gallbladder has been removed, the incisions will be closed with stitches (sutures), staples, or skin glue.  Your incisions may be covered with a bandage (dressing). The procedure may vary among health care providers and hospitals. What happens after the procedure?  Your blood pressure, heart rate, breathing rate, and blood oxygen level will be monitored until the medicines you were given have worn off.  You will be given medicines as needed to control your pain.  Do not drive for 24 hours if you were given a sedative. This information is not intended to replace advice given to you by your health care provider. Make sure you discuss any questions you have with your health care provider. Document Released: 10/22/2005 Document Revised: 10/04/2017 Document Reviewed: 04/09/2016 Elsevier Patient Education  2020 [employer]   

## 2019-07-14 NOTE — Progress Notes (Signed)
[facility] History and Physical  Reason for Referral: Chronic cholecystitis  Referring Physician: Terie PurserSamantha Jackson, PA   Chief Complaint    Abdominal Pain      Andrea Ayers is a 58 y.o. female.  HPI: Ms. Andrea Ayers is a 58 yo with a history of RA on methotrexate weekly who comes in with complaints of right back pain and also chronic nausea/vomiting she had always attributed to her RA and methotrexate dosing. She says the pain she thought was just the RA and the nausea from the drugs, but she started to have some RUQ abdominal pain and epigastric pain and says that she was having some pain with some greasy foods. This prompted workup with an US and HIDA scan, and she was found to have findings concerning for chronic cholecystitis.   She says that she has been offer her methotrexate for a few weeks because she thought this was making her worse. She says she has not had any RA flares.  She says that she had her worse episode of abdominal pain about 2 weeks ago and she does not want to have another episode. This is when her workup was completed.    Past Medical History:  Diagnosis Date  . Gallstones   . Hypothyroidism   . Rheumatoid arthritis [facility](HCC)     Past Surgical History:  Procedure Laterality Date  . APPENDECTOMY      Family History  Problem Relation Age of Onset  . Rheum arthritis Mother   . Thyroid disease Father   . Fibromyalgia Sister   . Asthma Brother   . Healthy Son     Social History   Tobacco Use  . Smoking status: Current Some Day Smoker    Packs/day: 0.50    Years: 40.00    Pack years: 20.00    Types: Cigarettes  . Smokeless tobacco: Never Used  Substance Use Topics  . Alcohol use: No  . Drug use: No    Medications: I have reviewed the patient's current medications. Allergies as of 07/14/2019      Reactions   Azithromycin Swelling      Medication List       Accurate as of July 14, 2019 11:59 PM. If you have any  questions, ask your nurse or doctor.        acetaminophen 500 MG tablet Commonly known as: TYLENOL Take 500-1,000 mg by mouth 3 (three) times daily as needed (for RA pain.).   famotidine 20 MG tablet Commonly known as: PEPCID Take 1 tablet (20 mg total) by mouth 2 (two) times daily. What changed:   when to take this  reasons to take this   folic acid 1 MG tablet Commonly known as: FOLVITE TAKE 2 TABLETS (2 MG TOTAL) BY MOUTH DAILY.   gabapentin 300 MG capsule Commonly known as: NEURONTIN Take 300 mg by mouth 3 (three) times daily.   HYDROcodone-acetaminophen 5-325 MG tablet Commonly known as: NORCO/VICODIN Take 1 tablet by mouth every 6 (six) hours as needed.   levothyroxine 175 MCG tablet Commonly known as: SYNTHROID Take 175 mcg by mouth daily before breakfast.   methotrexate 50 MG/2ML injection INJECT 0.8 ML (20 MG TOTAL) INTO SKIN ONCE A WEEK What changed:   how much to take  how to take this  when to take this  additional instructions   ondansetron 4 MG tablet Commonly known as: ZOFRAN Take 1 tablet (4 mg total) by mouth every 8 (eight) hours as needed for nausea  or vomiting.   predniSONE 5 MG tablet Commonly known as: DELTASONE Take 4 tablets by mouth daily x4 days, 3 tablets by mouth daily x4 days, 2 tablets by mouth daily x4 days, 1 tablet by mouth daily x4 days   Tuberculin-Allergy Syringes 27G X 1/2" 1 ML Misc Patient to use to inject Methotrexate once weekly.        ROS:  A comprehensive review of systems was negative except for: Gastrointestinal: positive for abdominal pain and nausea Musculoskeletal: positive for back pain, neck pain, stiff joints and RA Neurological: positive for tingling in extremities/ numbness  Blood pressure (!) 150/86, pulse 90, temperature (!) 97.5 F (36.4 C), temperature source Tympanic, resp. rate 16, height 5\' 4"  (1.626 m), weight 189 lb (85.7 kg), SpO2 96 %. Physical Exam Vitals signs reviewed.   Constitutional:      Appearance: She is well-developed.  HENT:     Head: Normocephalic and atraumatic.  Eyes:     Extraocular Movements: Extraocular movements intact.  Cardiovascular:     Rate and Rhythm: Normal rate and regular rhythm.  Pulmonary:     Effort: Pulmonary effort is normal.     Breath sounds: Normal breath sounds.  Abdominal:     General: There is no distension.     Palpations: Abdomen is soft.     Tenderness: There is abdominal tenderness in the right upper quadrant and epigastric area. There is no guarding or rebound.  Musculoskeletal: Normal range of motion.  Skin:    General: Skin is warm and dry.  Neurological:     General: No focal deficit present.     Mental Status: She is alert and oriented to person, place, and time.  Psychiatric:        Mood and Affect: Mood normal.        Behavior: Behavior normal.     Results: 8/21 CLINICAL DATA:  Severe RIGHT UPPER QUADRANT pain with nausea. Sudden onset of symptoms.  EXAM: ULTRASOUND ABDOMEN LIMITED RIGHT UPPER QUADRANT  COMPARISON:  Abdominal ultrasound on 06/12/2019  FINDINGS: Gallbladder:  Gallbladder has a normal appearance. Gallbladder wall is 5.0 millimeters, within normal limits. No stones or pericholecystic fluid. No sonographic Murphy's sign. The appearance of the gallbladder stable compared recent abdominal ultrasound.  Common bile duct:  Diameter: 3.0 milliliters  Liver:  No focal liver lesion. The liver is mildly echogenic, stable in appearance. Portal vein is patent on color Doppler imaging with normal direction of blood flow towards the liver.  Other: None.  IMPRESSION: 1. Nonspecific gallbladder wall thickening without sonographic Murphy sign or stones. 2. Mildly echogenic liver, consistent with mild hepatic steatosis. No focal liver lesion.   Electronically Signed   By: 08/12/2019 M.D.   On: 06/26/2019 13:01  HIDA 8/24 CLINICAL DATA:  RIGHT upper  quadrant pain with nausea and vomiting for 6 months  EXAM: NUCLEAR MEDICINE HEPATOBILIARY IMAGING  TECHNIQUE: Sequential images of the abdomen were obtained out to 60 minutes following intravenous administration of radiopharmaceutical.  RADIOPHARMACEUTICALS:  4.7 mCi Tc-31m  Choletec IV  COMPARISON:  Ultrasound abdomen 06/26/2019, 06/12/2019  FINDINGS: Prompt clearance of tracer from bloodstream indicating normal hepatocellular function.  Bowel tracer visualized at 11 minutes.  At 1 hour, gallbladder had not visualized.  Patient received 3.5 mg of morphine IV and imaging was continued for 30 minutes.  Faint visualization of gallbladder following following morphine indicating patent cystic duct.  No significant hepatic retention of tracer at conclusion of exam.  IMPRESSION: Delayed and  faint visualization of the gallbladder, seen only following morphine administration, indicating patency of the cystic duct.  Findings could be seen in the setting of chronic cholecystitis.   Electronically Signed   By: Lavonia Dana M.D.   On: 06/29/2019 14:17  Assessment & Plan:  Andrea Ayers is a 58 y.o. female with Chronic cholecystitis on HIDA scan. She is having RUQ pain and some pain in the back. This is likely related to the gallbladder. We discussed that her nausea and vomiting around the methotrexate dosing is likely not related to the gallbladder. She has been off methotrexate and does not plan to resume until after surgery. I told her we would do the surgery with her on it if needed.  Discussed reduced healing with methotrexate. She wants to remain off of it until after surgery and a few weeks past.   -PLAN: I counseled the patient about the indication, risks and benefits of laparoscopic cholecystectomy.  She understands there is a very small chance for bleeding, infection, injury to normal structures (including common bile duct), conversion to open surgery,  persistent symptoms, evolution of postcholecystectomy diarrhea, need for secondary interventions, anesthesia reaction, cardiopulmonary issues and other risks not specifically detailed here. I described the expected recovery, the plan for follow-up and the restrictions during the recovery phase.  All questions were answered.  All questions were answered to the satisfaction of the patient.   Virl Cagey 07/15/2019, 2:44 PM

## 2019-07-15 ENCOUNTER — Encounter (HOSPITAL_COMMUNITY)
Admission: RE | Admit: 2019-07-15 | Discharge: 2019-07-15 | Disposition: A | Discharge status: Home / Self Care | Payer: BC Managed Care – PPO | Source: Ambulatory Visit | Attending: General Surgery | Admitting: General Surgery

## 2019-07-15 ENCOUNTER — Encounter (HOSPITAL_COMMUNITY): Payer: Self-pay

## 2019-07-15 HISTORY — DX: Hypothyroidism, unspecified: E03.9

## 2019-07-15 NOTE — H&P (Signed)
[facility] History and Physical  Reason for Referral: Chronic cholecystitis  Referring Physician: Samantha Jackson, PA   Chief Complaint    Abdominal Pain      Andrea Ayers is a 57 y.o. female.  HPI: Andrea Ayers is a 57 yo with a history of RA on methotrexate weekly who comes in with complaints of right back pain and also chronic nausea/vomiting Andrea Ayers had always attributed to her RA and methotrexate dosing. Andrea Ayers says the pain Andrea Ayers thought was just the RA and the nausea from the drugs, but Andrea Ayers started to have some RUQ abdominal pain and epigastric pain and says that Andrea Ayers was having some pain with some greasy foods. This prompted workup with an US and HIDA scan, and Andrea Ayers was found to have findings concerning for chronic cholecystitis.   Andrea Ayers says that Andrea Ayers has been offer her methotrexate for a few weeks because Andrea Ayers thought this was making her worse. Andrea Ayers says Andrea Ayers has not had any RA flares.  Andrea Ayers says that Andrea Ayers had her worse episode of abdominal pain about 2 weeks ago and Andrea Ayers does not want to have another episode. This is when her workup was completed.    Past Medical History:  Diagnosis Date  . Gallstones   . Hypothyroidism   . Rheumatoid arthritis (HCC)     Past Surgical History:  Procedure Laterality Date  . APPENDECTOMY      Family History  Problem Relation Age of Onset  . Rheum arthritis Mother   . Thyroid disease Father   . Fibromyalgia Sister   . Asthma Brother   . Healthy Son     Social History   Tobacco Use  . Smoking status: Current Some Day Smoker    Packs/day: 0.50    Years: 40.00    Pack years: 20.00    Types: Cigarettes  . Smokeless tobacco: Never Used  Substance Use Topics  . Alcohol use: No  . Drug use: No    Medications: I have reviewed the patient's current medications. Allergies as of 07/14/2019      Reactions   Azithromycin Swelling      Medication List       Accurate as of July 14, 2019 11:59 PM. If you have any  questions, ask your nurse or doctor.        acetaminophen 500 MG tablet Commonly known as: TYLENOL Take 500-1,000 mg by mouth 3 (three) times daily as needed (for RA pain.).   famotidine 20 MG tablet Commonly known as: PEPCID Take 1 tablet (20 mg total) by mouth 2 (two) times daily. What changed:   when to take this  reasons to take this   folic acid 1 MG tablet Commonly known as: FOLVITE TAKE 2 TABLETS (2 MG TOTAL) BY MOUTH DAILY.   gabapentin 300 MG capsule Commonly known as: NEURONTIN Take 300 mg by mouth 3 (three) times daily.   HYDROcodone-acetaminophen 5-325 MG tablet Commonly known as: NORCO/VICODIN Take 1 tablet by mouth every 6 (six) hours as needed.   levothyroxine 175 MCG tablet Commonly known as: SYNTHROID Take 175 mcg by mouth daily before breakfast.   methotrexate 50 MG/2ML injection INJECT 0.8 ML (20 MG TOTAL) INTO SKIN ONCE A WEEK What changed:   how much to take  how to take this  when to take this  additional instructions   ondansetron 4 MG tablet Commonly known as: ZOFRAN Take 1 tablet (4 mg total) by mouth every 8 (eight) hours as needed for nausea   this  additional instructions   ondansetron 4 MG tablet Commonly known as: ZOFRAN Take 1 tablet (4 mg total) by mouth every 8 (eight) hours as needed for nausea or vomiting.   predniSONE 5 MG tablet Commonly known as: DELTASONE Take 4 tablets by mouth daily x4 days, 3 tablets by mouth daily x4 days, 2 tablets by mouth daily x4 days, 1 tablet by mouth daily x4 days   Tuberculin-Allergy Syringes 27G X 1/2" 1 ML Misc Patient to use to inject Methotrexate once weekly.        ROS:  A comprehensive review of systems was negative except for: Gastrointestinal: positive for abdominal pain and nausea Musculoskeletal: positive for back pain, neck pain, stiff joints and RA Neurological: positive for tingling in extremities/ numbness  Blood pressure (!) 150/86, pulse 90, temperature (!) 97.5 F (36.4 C), temperature source Tympanic, resp. rate 16, height 5\' 4"  (1.626 m), weight  189 lb (85.7 kg), SpO2 96 %. Physical Exam Vitals signs reviewed.  Constitutional:      Appearance: Andrea Ayers is well-developed.  HENT:     Head: Normocephalic and atraumatic.  Eyes:     Extraocular Movements: Extraocular movements intact.  Cardiovascular:     Rate and Rhythm: Normal rate and regular rhythm.  Pulmonary:     Effort: Pulmonary effort is normal.     Breath sounds: Normal breath sounds.  Abdominal:     General: There is no distension.     Palpations: Abdomen is soft.     Tenderness: There is abdominal tenderness in the right upper quadrant and epigastric area. There is no guarding or rebound.  Musculoskeletal: Normal range of motion.  Skin:    General: Skin is warm and dry.  Neurological:     General: No focal deficit present.     Mental Status: Andrea Ayers is alert and oriented to person, place, and time.  Psychiatric:        Mood and Affect: Mood normal.        Behavior: Behavior normal.     Results: US 8/21 CLINICAL DATA: Severe RIGHT UPPER QUADRANT pain with nausea. Sudden onset of symptoms.  EXAM: ULTRASOUND ABDOMEN LIMITED RIGHT UPPER QUADRANT  COMPARISON: Abdominal ultrasound on 06/12/2019  FINDINGS: Gallbladder:  Gallbladder has a normal appearance. Gallbladder wall is 5.0 millimeters, within normal limits. No stones or pericholecystic fluid. No sonographic Murphy's sign. The appearance of the gallbladder stable compared recent abdominal ultrasound.  Common bile duct:  Diameter: 3.0 milliliters  Liver:  No focal liver lesion. The liver is mildly echogenic, stable in appearance. Portal vein is patent on color Doppler imaging with normal direction of blood flow towards the liver.  Other: None.  IMPRESSION: 1. Nonspecific gallbladder wall thickening without sonographic Murphy sign or stones. 2. Mildly echogenic liver, consistent with mild hepatic steatosis. No focal liver lesion.   Electronically Signed By: Norva PavlovElizabeth Brown  M.D. On: 06/26/2019 13:01  HIDA 8/24 CLINICAL DATA: RIGHT upper quadrant pain with nausea and vomiting for 6 months  EXAM: NUCLEAR MEDICINE HEPATOBILIARY IMAGING  TECHNIQUE: Sequential images of the abdomen were obtained out to 60 minutes following intravenous administration of radiopharmaceutical.  RADIOPHARMACEUTICALS: 4.7 mCi Tc-532m Choletec IV  COMPARISON: Ultrasound abdomen 06/26/2019, 06/12/2019  FINDINGS: Prompt clearance of tracer from bloodstream indicating normal hepatocellular function.  Bowel tracer visualized at 11 minutes.  At 1 hour, gallbladder had not visualized.  Patient received 3.5 mg of morphine IV and imaging was continued for 30 minutes.  Faint visualization of gallbladder following following morphine  faint visualization of the gallbladder, seen only following morphine administration, indicating patency of the cystic duct.  Findings could be seen in the setting of chronic cholecystitis.   Electronically Signed   By: Mark  Boles M.D.   On: 06/29/2019 14:17  Assessment & Plan:  Andrea Ayers is a 57 y.o. female with Chronic cholecystitis on HIDA scan. Andrea Ayers is having RUQ pain and some pain in the back. This is likely related to the gallbladder. We discussed that her nausea and vomiting around the methotrexate dosing is likely not related to the gallbladder. Andrea Ayers has been off methotrexate and does not plan to resume until after surgery. I told her we would do the surgery with her on it if needed.  Discussed reduced healing with methotrexate. Andrea Ayers wants to remain off of it until after surgery and a few weeks past.   -PLAN: I counseled the patient about the indication, risks and benefits of laparoscopic cholecystectomy.  Andrea Ayers understands there is a very small chance for bleeding, infection, injury to normal structures (including common bile duct), conversion to open surgery,  persistent symptoms, evolution of postcholecystectomy diarrhea, need for secondary interventions, anesthesia reaction, cardiopulmonary issues and other risks not specifically detailed here. I described the expected recovery, the plan for follow-up and the restrictions during the recovery phase.  All questions were answered.  All questions were answered to the satisfaction of the patient.    C  07/15/2019, 2:44 PM       

## 2019-07-16 ENCOUNTER — Other Ambulatory Visit (HOSPITAL_COMMUNITY)
Admission: RE | Admit: 2019-07-16 | Discharge: 2019-07-16 | Disposition: A | Discharge status: Home / Self Care | Payer: BC Managed Care – PPO | Source: Ambulatory Visit | Attending: General Surgery | Admitting: General Surgery

## 2019-07-16 ENCOUNTER — Other Ambulatory Visit: Payer: Self-pay

## 2019-07-16 DIAGNOSIS — Z01812 Encounter for preprocedural laboratory examination: Secondary | ICD-10-CM | POA: Diagnosis not present

## 2019-07-16 DIAGNOSIS — Z20828 Contact with and (suspected) exposure to other viral communicable diseases: Secondary | ICD-10-CM | POA: Insufficient documentation

## 2019-07-16 LAB — SARS CORONAVIRUS 2 (TAT 6-24 HRS): SARS Coronavirus 2: NEGATIVE

## 2019-07-20 ENCOUNTER — Encounter (HOSPITAL_COMMUNITY): Payer: Self-pay | Admitting: *Deleted

## 2019-07-20 ENCOUNTER — Ambulatory Visit (HOSPITAL_COMMUNITY)
Admission: RE | Admit: 2019-07-20 | Discharge: 2019-07-20 | Disposition: A | Discharge status: Home / Self Care | Payer: BC Managed Care – PPO | Attending: General Surgery | Admitting: General Surgery

## 2019-07-20 ENCOUNTER — Encounter (HOSPITAL_COMMUNITY)
Admission: RE | Disposition: A | Discharge status: Home / Self Care | Payer: Self-pay | Source: Home / Self Care | Attending: General Surgery

## 2019-07-20 ENCOUNTER — Ambulatory Visit (HOSPITAL_COMMUNITY): Payer: BC Managed Care – PPO | Admitting: Certified Registered Nurse Anesthetist

## 2019-07-20 ENCOUNTER — Other Ambulatory Visit: Payer: Self-pay

## 2019-07-20 DIAGNOSIS — F17210 Nicotine dependence, cigarettes, uncomplicated: Secondary | ICD-10-CM | POA: Insufficient documentation

## 2019-07-20 DIAGNOSIS — R112 Nausea with vomiting, unspecified: Secondary | ICD-10-CM | POA: Diagnosis not present

## 2019-07-20 DIAGNOSIS — R1011 Right upper quadrant pain: Secondary | ICD-10-CM | POA: Diagnosis not present

## 2019-07-20 DIAGNOSIS — K811 Chronic cholecystitis: Secondary | ICD-10-CM | POA: Diagnosis not present

## 2019-07-20 DIAGNOSIS — Z79890 Hormone replacement therapy (postmenopausal): Secondary | ICD-10-CM | POA: Insufficient documentation

## 2019-07-20 DIAGNOSIS — Z79899 Other long term (current) drug therapy: Secondary | ICD-10-CM | POA: Diagnosis not present

## 2019-07-20 DIAGNOSIS — E039 Hypothyroidism, unspecified: Secondary | ICD-10-CM | POA: Diagnosis not present

## 2019-07-20 DIAGNOSIS — M069 Rheumatoid arthritis, unspecified: Secondary | ICD-10-CM | POA: Diagnosis not present

## 2019-07-20 HISTORY — PX: CHOLECYSTECTOMY: SHX55

## 2019-07-20 SURGERY — LAPAROSCOPIC CHOLECYSTECTOMY
Anesthesia: General | Site: Abdomen

## 2019-07-20 MED ORDER — BUPIVACAINE HCL (PF) 0.5 % IJ SOLN
INTRAMUSCULAR | Status: DC | PRN
  Administered 2019-07-20: 10 mL

## 2019-07-20 MED ORDER — BUPIVACAINE HCL (PF) 0.5 % IJ SOLN
INTRAMUSCULAR | Status: AC
  Filled 2019-07-20: qty 30

## 2019-07-20 MED ORDER — SUCCINYLCHOLINE CHLORIDE 20 MG/ML IJ SOLN
INTRAMUSCULAR | Status: DC | PRN
  Administered 2019-07-20: 120 mg via INTRAVENOUS

## 2019-07-20 MED ORDER — MIDAZOLAM HCL 5 MG/5ML IJ SOLN
INTRAMUSCULAR | Status: DC | PRN
  Administered 2019-07-20: 2 mg via INTRAVENOUS

## 2019-07-20 MED ORDER — FENTANYL CITRATE (PF) 100 MCG/2ML IJ SOLN
INTRAMUSCULAR | Status: AC
  Filled 2019-07-20: qty 4

## 2019-07-20 MED ORDER — MEPERIDINE HCL 50 MG/ML IJ SOLN: 6.2500 mg | INTRAMUSCULAR | Status: DC | PRN

## 2019-07-20 MED ORDER — ROCURONIUM BROMIDE 10 MG/ML (PF) SYRINGE
PREFILLED_SYRINGE | INTRAVENOUS | Status: AC
  Filled 2019-07-20: qty 10

## 2019-07-20 MED ORDER — CHLORHEXIDINE GLUCONATE CLOTH 2 % EX PADS: 6.0000 | MEDICATED_PAD | Freq: Once | CUTANEOUS | Status: DC

## 2019-07-20 MED ORDER — ONDANSETRON HCL 4 MG/2ML IJ SOLN
INTRAMUSCULAR | Status: DC | PRN
  Administered 2019-07-20: 4 mg via INTRAVENOUS

## 2019-07-20 MED ORDER — MIDAZOLAM HCL 2 MG/2ML IJ SOLN
INTRAMUSCULAR | Status: AC
  Filled 2019-07-20: qty 2

## 2019-07-20 MED ORDER — SUGAMMADEX SODIUM 200 MG/2ML IV SOLN
INTRAVENOUS | Status: DC | PRN
  Administered 2019-07-20: 170 mg via INTRAVENOUS

## 2019-07-20 MED ORDER — KETOROLAC TROMETHAMINE 30 MG/ML IJ SOLN
INTRAMUSCULAR | Status: AC
  Filled 2019-07-20: qty 1

## 2019-07-20 MED ORDER — SUCCINYLCHOLINE CHLORIDE 200 MG/10ML IV SOSY
PREFILLED_SYRINGE | INTRAVENOUS | Status: AC
  Filled 2019-07-20: qty 20

## 2019-07-20 MED ORDER — FENTANYL CITRATE (PF) 100 MCG/2ML IJ SOLN
INTRAMUSCULAR | Status: AC
  Filled 2019-07-20: qty 2

## 2019-07-20 MED ORDER — LACTATED RINGERS IV SOLN
INTRAVENOUS | Status: DC | PRN
  Administered 2019-07-20: 08:00:00 via INTRAVENOUS

## 2019-07-20 MED ORDER — HEMOSTATIC AGENTS (NO CHARGE) OPTIME
TOPICAL | Status: DC | PRN
  Administered 2019-07-20: 1 via TOPICAL

## 2019-07-20 MED ORDER — 0.9 % SODIUM CHLORIDE (POUR BTL) OPTIME
TOPICAL | Status: DC | PRN
  Administered 2019-07-20: 1000 mL

## 2019-07-20 MED ORDER — ARTIFICIAL TEARS OPHTHALMIC OINT
TOPICAL_OINTMENT | OPHTHALMIC | Status: AC
  Filled 2019-07-20: qty 3.5

## 2019-07-20 MED ORDER — PROPOFOL 10 MG/ML IV BOLUS
INTRAVENOUS | Status: DC | PRN
  Administered 2019-07-20: 20 mg via INTRAVENOUS
  Administered 2019-07-20: 160 mg via INTRAVENOUS
  Administered 2019-07-20: 20 mg via INTRAVENOUS

## 2019-07-20 MED ORDER — LACTATED RINGERS IV SOLN
Freq: Once | INTRAVENOUS | Status: AC
  Administered 2019-07-20: 08:00:00 via INTRAVENOUS

## 2019-07-20 MED ORDER — PROPOFOL 10 MG/ML IV BOLUS
INTRAVENOUS | Status: AC
  Filled 2019-07-20: qty 20

## 2019-07-20 MED ORDER — DOCUSATE SODIUM 100 MG PO CAPS: 100.0000 mg | ORAL_CAPSULE | Freq: Two times a day (BID) | ORAL | 2 refills | Status: AC

## 2019-07-20 MED ORDER — HYDROMORPHONE HCL 1 MG/ML IJ SOLN
0.2500 mg | INTRAMUSCULAR | Status: AC | PRN
  Administered 2019-07-20 (×6): 0.25 mg via INTRAVENOUS
  Filled 2019-07-20 (×4): qty 0.5

## 2019-07-20 MED ORDER — OXYCODONE HCL 5 MG PO TABS: 5.0000 mg | ORAL_TABLET | ORAL | 0 refills | Status: AC | PRN

## 2019-07-20 MED ORDER — ROCURONIUM BROMIDE 100 MG/10ML IV SOLN
INTRAVENOUS | Status: DC | PRN
  Administered 2019-07-20: 30 mg via INTRAVENOUS

## 2019-07-20 MED ORDER — PROMETHAZINE HCL 25 MG/ML IJ SOLN
6.2500 mg | INTRAMUSCULAR | Status: DC | PRN
  Administered 2019-07-20: 6.25 mg via INTRAVENOUS
  Filled 2019-07-20: qty 1

## 2019-07-20 MED ORDER — SODIUM CHLORIDE 0.9 % IV SOLN
2.0000 g | INTRAVENOUS | Status: AC
  Administered 2019-07-20: 2 g via INTRAVENOUS
  Filled 2019-07-20: qty 2

## 2019-07-20 MED ORDER — ONDANSETRON HCL 4 MG PO TABS: 4.0000 mg | ORAL_TABLET | Freq: Three times a day (TID) | ORAL | 0 refills | Status: AC | PRN

## 2019-07-20 MED ORDER — LIDOCAINE HCL 1 % IJ SOLN
INTRAMUSCULAR | Status: DC | PRN
  Administered 2019-07-20: 60 mg via INTRADERMAL

## 2019-07-20 MED ORDER — FENTANYL CITRATE (PF) 100 MCG/2ML IJ SOLN
INTRAMUSCULAR | Status: DC | PRN
  Administered 2019-07-20 (×2): 50 ug via INTRAVENOUS
  Administered 2019-07-20: 25 ug via INTRAVENOUS
  Administered 2019-07-20: 50 ug via INTRAVENOUS
  Administered 2019-07-20: 25 ug via INTRAVENOUS
  Administered 2019-07-20 (×2): 50 ug via INTRAVENOUS

## 2019-07-20 SURGICAL SUPPLY — 48 items
APPLIER CLIP ROT 10 11.4 M/L (STAPLE) ×2
BAG RETRIEVAL 10 (BASKET) ×1
BLADE SURG 15 STRL LF DISP TIS (BLADE) ×1 IMPLANT
BLADE SURG 15 STRL SS (BLADE) ×1
CHLORAPREP W/TINT 26 (MISCELLANEOUS) ×2 IMPLANT
CLIP APPLIE ROT 10 11.4 M/L (STAPLE) ×1 IMPLANT
CLOTH BEACON ORANGE TIMEOUT ST (SAFETY) ×2 IMPLANT
COVER LIGHT HANDLE STERIS (MISCELLANEOUS) ×4 IMPLANT
COVER WAND RF STERILE (DRAPES) ×2 IMPLANT
DECANTER SPIKE VIAL GLASS SM (MISCELLANEOUS) ×2 IMPLANT
DERMABOND ADVANCED (GAUZE/BANDAGES/DRESSINGS) ×1
DERMABOND ADVANCED .7 DNX12 (GAUZE/BANDAGES/DRESSINGS) ×1 IMPLANT
ELECT REM PT RETURN 9FT ADLT (ELECTROSURGICAL) ×2
ELECTRODE REM PT RTRN 9FT ADLT (ELECTROSURGICAL) ×1 IMPLANT
FILTER SMOKE EVAC LAPAROSHD (FILTER) ×2 IMPLANT
GLOVE BIO SURGEON STRL SZ 6.5 (GLOVE) ×2 IMPLANT
GLOVE BIO SURGEON STRL SZ7 (GLOVE) ×2 IMPLANT
GLOVE BIOGEL PI IND STRL 6.5 (GLOVE) ×1 IMPLANT
GLOVE BIOGEL PI IND STRL 7.0 (GLOVE) ×3 IMPLANT
GLOVE BIOGEL PI IND STRL 7.5 (GLOVE) ×1 IMPLANT
GLOVE BIOGEL PI INDICATOR 6.5 (GLOVE) ×1
GLOVE BIOGEL PI INDICATOR 7.0 (GLOVE) ×3
GLOVE BIOGEL PI INDICATOR 7.5 (GLOVE) ×1
GLOVE ECLIPSE 6.5 STRL STRAW (GLOVE) ×2 IMPLANT
GLOVE SURG SS PI 7.5 STRL IVOR (GLOVE) ×2 IMPLANT
GOWN STRL REUS W/TWL LRG LVL3 (GOWN DISPOSABLE) ×8 IMPLANT
HEMOSTAT SNOW SURGICEL 2X4 (HEMOSTASIS) ×2 IMPLANT
INST SET LAPROSCOPIC AP (KITS) ×2 IMPLANT
IV NS IRRIG 3000ML ARTHROMATIC (IV SOLUTION) IMPLANT
KIT TURNOVER KIT A (KITS) ×2 IMPLANT
MANIFOLD NEPTUNE II (INSTRUMENTS) ×2 IMPLANT
NEEDLE INSUFFLATION 14GA 120MM (NEEDLE) ×2 IMPLANT
NS IRRIG 1000ML POUR BTL (IV SOLUTION) ×2 IMPLANT
PACK LAP CHOLE LZT030E (CUSTOM PROCEDURE TRAY) ×2 IMPLANT
PAD ARMBOARD 7.5X6 YLW CONV (MISCELLANEOUS) ×2 IMPLANT
SET BASIN LINEN APH (SET/KITS/TRAYS/PACK) ×2 IMPLANT
SET TUBE IRRIG SUCTION NO TIP (IRRIGATION / IRRIGATOR) IMPLANT
SLEEVE ENDOPATH XCEL 5M (ENDOMECHANICALS) ×2 IMPLANT
SUT MNCRL AB 4-0 PS2 18 (SUTURE) ×4 IMPLANT
SUT VICRYL 0 UR6 27IN ABS (SUTURE) ×2 IMPLANT
SYS BAG RETRIEVAL 10MM (BASKET) ×1
SYSTEM BAG RETRIEVAL 10MM (BASKET) ×1 IMPLANT
TROCAR ENDO BLADELESS 11MM (ENDOMECHANICALS) ×2 IMPLANT
TROCAR XCEL NON-BLD 5MMX100MML (ENDOMECHANICALS) ×2 IMPLANT
TROCAR XCEL UNIV SLVE 11M 100M (ENDOMECHANICALS) ×2 IMPLANT
TUBE CONNECTING 12X1/4 (SUCTIONS) ×2 IMPLANT
TUBING INSUFFLATION (TUBING) ×2 IMPLANT
WARMER LAPAROSCOPE (MISCELLANEOUS) ×2 IMPLANT

## 2019-07-20 NOTE — Transfer of Care (Signed)
Immediate Anesthesia Transfer of Care Note  Patient: Andrea Ayers  Procedure(s) Performed: LAPAROSCOPIC CHOLECYSTECTOMY (N/A Abdomen)  Patient Location: PACU  Anesthesia Type:General  Level of Consciousness: awake  Airway & Oxygen Therapy: Patient Spontanous Breathing  Post-op Assessment: Report given to RN  Post vital signs: Reviewed and stable  Last Vitals:  Vitals Value Taken Time  BP 134/80 07/20/19 1030  Temp 36.4 C 07/20/19 1029  Pulse 78 07/20/19 1033  Resp 27 07/20/19 1033  SpO2 93 % 07/20/19 1033  Vitals shown include unvalidated device data.  Last Pain:  Vitals:   07/20/19 0808  TempSrc: Oral  PainSc: 0-No pain         Complications: No apparent anesthesia complications

## 2019-07-20 NOTE — Anesthesia Preprocedure Evaluation (Signed)
Anesthesia Evaluation  Patient identified by MRN, date of birth, ID band Patient awake    Reviewed: Allergy & Precautions, NPO status , Patient's Chart, lab work & pertinent test results  Airway Mallampati: II  TM Distance: >3 FB Neck ROM: Full   Comment: Neck pain Dental  (+) Edentulous Lower, Edentulous Upper   Pulmonary Current Smoker and Patient abstained from smoking.,    Pulmonary exam normal        Cardiovascular Exercise Tolerance: Good  Rhythm:Regular Rate:Normal     Neuro/Psych    GI/Hepatic   Endo/Other  Hypothyroidism   Renal/GU      Musculoskeletal  (+) Arthritis , Rheumatoid disorders,    Abdominal   Peds  Hematology   Anesthesia Other Findings   Reproductive/Obstetrics                             Anesthesia Physical Anesthesia Plan  ASA: II  Anesthesia Plan: General   Post-op Pain Management:    Induction: Intravenous  PONV Risk Score and Plan: 3 and Dexamethasone and Ondansetron  Airway Management Planned: Oral ETT  Additional Equipment:   Intra-op Plan:   Post-operative Plan: Extubation in OR  Informed Consent: I have reviewed the patients History and Physical, chart, labs and discussed the procedure including the risks, benefits and alternatives for the proposed anesthesia with the patient or authorized representative who has indicated his/her understanding and acceptance.       Plan Discussed with: CRNA  Anesthesia Plan Comments:         Anesthesia Quick Evaluation

## 2019-07-20 NOTE — Progress Notes (Signed)
[facility]  Notified Lavell Luster that surgery is complete.   Curlene Labrum, MD [facility] [address] [place], [place] [address] [phone] (office)

## 2019-07-20 NOTE — Anesthesia Postprocedure Evaluation (Signed)
Anesthesia Post Note  Patient: Andrea Ayers  Procedure(s) Performed: LAPAROSCOPIC CHOLECYSTECTOMY (N/A Abdomen)  Patient location during evaluation: PACU Anesthesia Type: General Level of consciousness: awake and alert and oriented Pain management: pain level controlled Vital Signs Assessment: post-procedure vital signs reviewed and stable Respiratory status: spontaneous breathing Cardiovascular status: blood pressure returned to baseline and stable Postop Assessment: no apparent nausea or vomiting Anesthetic complications: no     Last Vitals:  Vitals:   07/20/19 1123 07/20/19 1135  BP:  (!) 150/80  Pulse: 76 85  Resp: 12 18  Temp:  36.6 C  SpO2: 96% 90%    Last Pain:  Vitals:   07/20/19 1135  TempSrc: Oral  PainSc: 4                  Nygel Prokop

## 2019-07-20 NOTE — Interval H&P Note (Signed)
History and Physical Interval Note:  07/20/2019 9:00 AM  Andrea Ayers  has presented today for surgery, with the diagnosis of chronic cholelithiasis.  The various methods of treatment have been discussed with the patient and family. After consideration of risks, benefits and other options for treatment, the patient has consented to  Procedure(s): LAPAROSCOPIC CHOLECYSTECTOMY (N/A) as a surgical intervention.  The patient's history has been reviewed, patient examined, no change in status, stable for surgery.  I have reviewed the patient's chart and labs.  Questions were answered to the patient's satisfaction.    No changes.  Virl Cagey

## 2019-07-20 NOTE — Discharge Instructions (Signed)
Discharge Laparoscopic Surgery Instructions: ° °Common Complaints: °Right shoulder pain is common after laparoscopic surgery. This is secondary to the gas used in the surgery being trapped under the diaphragm.  °Walk to help your body absorb the gas. This will improve in a few days. °Pain at the port sites are common, especially the larger port sites. This will improve with time.  °Some nausea is common and poor appetite. The main goal is to stay hydrated the first few days after surgery.  ° °Diet/ Activity: °Diet as tolerated. You may not have an appetite, but it is important to stay hydrated. Drink 64 ounces of water a day. Your appetite will return with time.  °Shower per your regular routine daily.  Do not take hot showers. Take warm showers that are less than 10 minutes. °Rest and listen to your body, but do not remain in bed all day.  °Walk everyday for at least 15-20 minutes. Deep cough and move around every 1-2 hours in the first few days after surgery.  °Do not lift > 10 lbs, perform excessive bending, pushing, pulling, squatting for 1-2 weeks after surgery.  °Do not pick at the dermabond glue on your incision sites.  This glue film will remain in place for 1-2 weeks and will start to peel off.  °Do not place lotions or balms on your incision unless instructed to specifically by Dr. Joie Hipps.  ° °Medication: °Take tylenol and ibuprofen as needed for pain control, alternating every 4-6 hours.  °Example:  °Tylenol 1000mg @ 6am, 12noon, 6pm, 12midnight (Do not exceed 4000mg of tylenol a day). Ibuprofen 800mg @ 9am, 3pm, 9pm, 3am (Do not exceed 3600mg of ibuprofen a day).  °Take Roxicodone for breakthrough pain every 4 hours.  °Take Colace for constipation related to narcotic pain medication. If you do not have a bowel movement in 2 days, take Miralax over the counter.  °Drink plenty of water to also prevent constipation.  ° °Contact Information: °If you have questions or concerns, please call our office,  [phone], Monday- Thursday 8AM-5PM and Friday 8AM-12Noon.  °If it is after hours or on the weekend, please call [facility], [phone], and ask to speak to the surgeon on call for Dr. Cliff Damiani at [place].  ° ° °Laparoscopic Cholecystectomy, Care After °This sheet gives you information about how to care for yourself after your procedure. Your doctor may also give you more specific instructions. If you have problems or questions, contact your doctor. °Follow these instructions at home: °Care for cuts from surgery (incisions) ° °· Follow instructions from your doctor about how to take care of your cuts from surgery. Make sure you: °? Wash your hands with soap and water before you change your bandage (dressing). If you cannot use soap and water, use hand sanitizer. °? Change your bandage as told by your doctor. °? Leave stitches (sutures), skin glue, or skin tape (adhesive) strips in place. They may need to stay in place for 2 weeks or longer. If tape strips get loose and curl up, you may trim the loose edges. Do not remove tape strips completely unless your doctor says it is okay. °· Do not take baths, swim, or use a hot tub until your doctor says it is okay.  °· You may shower. °· Check your surgical cut area every day for signs of infection. Check for: °? More redness, swelling, or pain. °? More fluid or blood. °? Warmth. °? Pus or a bad smell. °Activity °· Do   not drive or use heavy machinery while taking prescription pain medicine. °· Do not lift anything that is heavier than 10 lb (4.5 kg) until your doctor says it is okay. °· Do not play contact sports until your doctor says it is okay. °· Do not drive for 24 hours if you were given a medicine to help you relax (sedative). °· Rest as needed. Do not return to work or school until your doctor says it is okay. °General instructions °· Take over-the-counter and prescription medicines only as told by your doctor. °· To prevent or treat constipation  while you are taking prescription pain medicine, your doctor may recommend that you: °? Drink enough fluid to keep your pee (urine) clear or pale yellow. °? Take over-the-counter or prescription medicines. °? Eat foods that are high in fiber, such as fresh fruits and vegetables, whole grains, and beans. °? Limit foods that are high in fat and processed sugars, such as fried and sweet foods. °Contact a doctor if: °· You develop a rash. °· You have more redness, swelling, or pain around your surgical cuts. °· You have more fluid or blood coming from your surgical cuts. °· Your surgical cuts feel warm to the touch. °· You have pus or a bad smell coming from your surgical cuts. °· You have a fever. °· One or more of your surgical cuts breaks open. °Get help right away if: °· You have trouble breathing. °· You have chest pain. °· You have pain that is getting worse in your shoulders. °· You faint or feel dizzy when you stand. °· You have very bad pain in your belly (abdomen). °· You are sick to your stomach (nauseous) for more than one day. °· You have throwing up (vomiting) that lasts for more than one day. °· You have leg pain. °This information is not intended to replace advice given to you by your health care provider. Make sure you discuss any questions you have with your health care provider. °Document Released: 07/31/2008 Document Revised: 10/04/2017 Document Reviewed: 04/09/2016 °Elsevier Patient Education © 2020 [employer] ° °

## 2019-07-20 NOTE — Op Note (Signed)
Operative Note   Preoperative Diagnosis: Chronic cholecystitis    Postoperative Diagnosis: Same   Procedure(s) Performed: Laparoscopic cholecystectomy   Surgeon: Ria Comment C. Constance Haw, MD   Assistants: Aviva Signs, MD    Anesthesia: General endotracheal   Anesthesiologist: Dr. Charna Elizabeth   Specimens: Gallbladder    Estimated Blood Loss: Minimal    Blood Replacement: None    Complications: None    Operative Findings:  Distended gallbladder    Procedure: The patient was taken to the operating room and placed supine. General endotracheal anesthesia was induced. Intravenous antibiotics were administered per protocol. An orogastric tube positioned to decompress the stomach. The abdomen was prepared and draped in the usual sterile fashion.    A supraumbilical incision was made and a Veress technique was utilized to achieve pneumoperitoneum to 15 mmHg with carbon dioxide. A 11 mm optiview port was placed through the supraumbilical region, and a 10 mm 0-degree operative laparoscope was introduced. The area underlying the trocar and Veress needle were inspected and without evidence of injury.  Remaining trocars were placed under direct vision. Two 5 mm ports were placed in the right abdomen, between the anterior axillary and midclavicular line.  A final 11 mm port was placed through the mid-epigastrium, near the falciform ligament.    The gallbladder fundus was elevated cephalad and the infundibulum was retracted to the patient's right. The gallbladder/cystic duct junction was skeletonized. The cystic artery noted in the triangle of Calot and was also skeletonized.  We then continued liberal medial and lateral dissection until the critical view of safety was achieved.    The cystic duct and cystic artery were triply clipped and divided. The posterior mesentery had a small possible vessel that was clipped.  The gallbladder was then dissected from the liver bed with electrocautery. The specimen was  placed in an Endopouch and was retrieved through the epigastric site.   Final inspection revealed acceptable hemostasis. Surgical Emogene Morgan was placed in the gallbladder bed. Trocars were removed and pneumoperitoneum was released. 0 Vicryl fascial sutures were used to close the epigastric and umbilical port sites. Skin incisions were closed with 4-0 Monocryl subcuticular sutures and Dermabond. The patient was awakened from anesthesia and extubated without complication.    Curlene Labrum, MD [facility] [address] [place], [place] [address] [phone] (office)

## 2019-07-20 NOTE — Anesthesia Procedure Notes (Signed)
Procedure Name: Intubation Date/Time: 07/20/2019 9:24 AM Performed by: Ollen Bowl, CRNA Pre-anesthesia Checklist: Patient identified, Patient being monitored, Timeout performed, Emergency Drugs available and Suction available Patient Re-evaluated:Patient Re-evaluated prior to induction Oxygen Delivery Method: Circle system utilized Preoxygenation: Pre-oxygenation with 100% oxygen Induction Type: IV induction Ventilation: Mask ventilation without difficulty Laryngoscope Size: Mac and 3 Grade View: Grade I Tube type: Oral Tube size: 7.0 mm Number of attempts: 1 Airway Equipment and Method: Stylet Placement Confirmation: ETT inserted through vocal cords under direct vision,  positive ETCO2 and breath sounds checked- equal and bilateral Secured at: 21 cm Tube secured with: Tape Dental Injury: Teeth and Oropharynx as per pre-operative assessment

## 2019-07-21 ENCOUNTER — Encounter (HOSPITAL_COMMUNITY): Payer: Self-pay | Admitting: General Surgery

## 2019-07-23 ENCOUNTER — Other Ambulatory Visit: Payer: BC Managed Care – PPO | Admitting: Family Medicine

## 2019-08-04 ENCOUNTER — Telehealth (INDEPENDENT_AMBULATORY_CARE_PROVIDER_SITE_OTHER): Payer: Self-pay | Admitting: General Surgery

## 2019-08-04 ENCOUNTER — Other Ambulatory Visit: Payer: Self-pay

## 2019-08-04 DIAGNOSIS — K811 Chronic cholecystitis: Secondary | ICD-10-CM

## 2019-08-04 NOTE — Progress Notes (Signed)
[facility]  I am calling the patient for post operative evaluation due to the current COVID 19 pandemic.  The patient had a laparoscopic cholecystectomy on 07/20/19. I called the patient multiple times and tried Mickey her roommate but could not get an answer. I left a voicemail. Told patient to call back with any concerns. Activity as tolerated, pathology all good.   Will see the patient PRN.   Pathology: Diagnosis Gallbladder - CHRONIC CHOLECYSTITIS  Curlene Labrum, MD [facility] [address] [place], [place] [address] [phone] (office)

## 2019-08-10 LAB — CBC NO DIFFERENTIAL
Hematocrit: 29.8 % — ABNORMAL LOW (ref 34.0–46.0)
Hemoglobin: 9.6 g/dL — ABNORMAL LOW (ref 11.5–15.0)
MCV: 90 fL — NL (ref 80–100)
Platelets count: 280 10*3/uL — NL (ref 140–400)
RDW, RBC: 12.5 % — NL (ref 12.0–16.5)
Red blood cells count: 3.33 M/uL — ABNORMAL LOW (ref 3.60–5.70)
WBC Count: 7.1 10*3/uL — NL (ref 3.7–11.1)

## 2019-08-10 LAB — WBC AUTO DIFF (OUTSIDE ORGANIZATION)
Basophils % Auto: 0 % — NL (ref 0–1)
Eosinophils % Auto: 0 % — NL (ref 0–7)
Immature Granulocytes % Auto: 0 % — NL (ref 0–1)
Lymphocytes % Auto: 27 % — NL (ref 15–47)
Monocytes % Auto: 8 % — NL (ref 5–13)
Neutrophils % Auto: 65 % — NL (ref 42–76)
Neutrophils, Automated Count: 4.6 10*3/uL — NL (ref 1.8–7.9)

## 2019-08-10 LAB — SODIUM: Sodium: 140 meq/L — NL (ref 135–145)

## 2019-08-10 LAB — POTASSIUM: Potassium: 4 meq/L — NL (ref 3.5–5.3)

## 2019-08-11 LAB — THYROID STIMULATING HORMONE: Thyroid Stimulating Hormone: 5.9 u[IU]/mL — ABNORMAL HIGH (ref 0.4–4.5)

## 2019-08-11 LAB — HEMOGLOBIN A1C: Hgb A1C,Glucose Est Avg: 240 mg/dL — ABNORMAL HIGH (ref 85–126)

## 2019-08-11 LAB — MICROALBUMIN

## 2019-08-29 LAB — HAPPY TOGETHER UNMAPPED RESULTS: COVID-19 Results (Outside Organization): NOT DETECTED — NL

## 2019-10-13 ENCOUNTER — Ambulatory Visit: Payer: BC Managed Care – PPO | Admitting: Physician Assistant

## 2019-10-13 ENCOUNTER — Other Ambulatory Visit: Payer: BC Managed Care – PPO | Admitting: Women's Health

## 2019-10-15 ENCOUNTER — Other Ambulatory Visit: Payer: BC Managed Care – PPO | Admitting: Women's Health

## 2019-10-15 LAB — CHEM 7 (NA, K, CL, CO2, BUN, GLUC, CR) (OUTSIDE ORGANIZATION)
Anion Gap: 11 meq/L — NL (ref 5–16)
CO2: 24 meq/L — NL (ref 24–33)
Chloride: 96 meq/L — ABNORMAL LOW (ref 100–111)
Glucose: 538 mg/dL — CR (ref 60–159)
Potassium: 3.8 meq/L — NL (ref 3.5–5.3)
Sodium: 131 meq/L — ABNORMAL LOW (ref 135–145)
Urea Nitrogen, Blood (BUN): 28 mg/dL — ABNORMAL HIGH (ref 7–27)

## 2019-10-15 LAB — WBC AUTO DIFF (OUTSIDE ORGANIZATION)
Basophils % Auto: 0 % — NL (ref 0–1)
Eosinophils % Auto: 0 % — NL (ref 0–7)
Immature Granulocytes % Auto: 0 % — NL (ref 0–1)
Lymphocytes % Auto: 10 % — ABNORMAL LOW (ref 15–47)
Monocytes % Auto: 4 % — ABNORMAL LOW (ref 5–13)
Neutrophils % Auto: 86 % — ABNORMAL HIGH (ref 42–76)
Neutrophils, Automated Count: 10.3 10*3/uL — ABNORMAL HIGH (ref 1.8–7.9)

## 2019-10-15 LAB — TROPONIN I: Troponin I: 0.03 ng/mL — NL (ref 0.00–0.04)

## 2019-10-15 LAB — CBC WITH DIFFERENTIAL
Hematocrit: 31.1 % — ABNORMAL LOW (ref 34.0–46.0)
Hemoglobin: 10.4 g/dL — ABNORMAL LOW (ref 11.5–15.0)
MCV: 83 fL — NL (ref 80–100)
Platelets count: 287 10*3/uL — NL (ref 140–400)
RDW, RBC: 12.6 % — NL (ref 12.0–16.5)
Red blood cells count: 3.75 M/uL — NL (ref 3.60–5.70)
WBC Count: 12 10*3/uL — ABNORMAL HIGH (ref 3.7–11.1)

## 2019-10-16 LAB — AMMONIA: Ammonia, blood: 30 umol/L — NL (ref 16–53)

## 2019-10-16 LAB — TROPONIN I
Troponin I: 0.03 ng/mL — NL (ref 0.00–0.04)
Troponin I: 0.07 ng/mL — ABNORMAL HIGH (ref 0.00–0.04)
Troponin I: 0.06 ng/mL — ABNORMAL HIGH (ref 0.00–0.04)

## 2019-10-16 LAB — CBC WITH DIFFERENTIAL
Hematocrit: 28.1 % — ABNORMAL LOW (ref 34.0–46.0)
Hemoglobin: 9.4 g/dL — ABNORMAL LOW (ref 11.5–15.0)
MCV: 85 fL — NL (ref 80–100)
Platelets count: 277 10*3/uL — NL (ref 140–400)
RDW, RBC: 12.9 % — NL (ref 12.0–16.5)
Red blood cells count: 3.31 M/uL — ABNORMAL LOW (ref 3.60–5.70)
WBC Count: 10.3 10*3/uL — NL (ref 3.7–11.1)

## 2019-10-16 LAB — CHEM 7 (NA, K, CL, CO2, BUN, GLUC, CR) (OUTSIDE ORGANIZATION)
Anion Gap: 11 meq/L — NL (ref 5–16)
CO2: 23 meq/L — ABNORMAL LOW (ref 24–33)
Chloride: 100 meq/L — NL (ref 100–111)
Glucose: 339 mg/dL — ABNORMAL HIGH (ref 60–159)
Potassium: 3.9 meq/L — NL (ref 3.5–5.3)
Sodium: 134 meq/L — ABNORMAL LOW (ref 135–145)
Urea Nitrogen, Blood (BUN): 32 mg/dL — ABNORMAL HIGH (ref 7–27)

## 2019-10-16 LAB — BLD GAS VENOUS
Base Excess, Ven: -1 mmol/L — NL (ref ?–2.4)
HCO3, Ven: 23.1 mmol/L — NL (ref 23.0–28.0)
Oxygen, partial pressure, venous: 76 mm[Hg] — ABNORMAL HIGH (ref 35–45)
PCO2, Ven: 38 mm[Hg] — NL (ref 38–50)
pH, VEN: 7.4 — NL (ref 7.32–7.43)

## 2019-10-16 LAB — WBC AUTO DIFF (OUTSIDE ORGANIZATION)
Basophils % Auto: 0 % — NL (ref 0–1)
Eosinophils % Auto: 0 % — NL (ref 0–7)
Immature Granulocytes % Auto: 0 % — NL (ref 0–1)
Lymphocytes % Auto: 10 % — ABNORMAL LOW (ref 15–47)
Monocytes % Auto: 4 % — ABNORMAL LOW (ref 5–13)
Neutrophils % Auto: 85 % — ABNORMAL HIGH (ref 42–76)
Neutrophils, Automated Count: 8.8 10*3/uL — ABNORMAL HIGH (ref 1.8–7.9)

## 2019-10-16 LAB — ALKALINE PHOSPHATASE (ALP): Alkaline Phosphatase (ALP): 124 U/L — ABNORMAL HIGH (ref 37–117)

## 2019-10-16 LAB — URINALYSIS W/MICROSCOPIC
RBC, urine: 12 /[HPF] — ABNORMAL HIGH (ref 0–3)
Squamous cells count, ur sed, Qn: 11 /[LPF] — ABNORMAL HIGH (ref 0–5)
WBC, urine: 1 /[HPF] — NL (ref 0–5)

## 2019-10-16 LAB — MAGNESIUM (MG): Magnesium (Mg): 1.3 mg/dL — ABNORMAL LOW (ref 1.7–2.3)

## 2019-10-16 LAB — ALANINE TRANSFERASE (ALT): Alanine Transferase (ALT): 16 U/L — NL (ref 0–41)

## 2019-10-16 LAB — ASPARTATE TRANSAMINASE (AST): Aspartate Transaminase (AST): 16 U/L — NL (ref 10–40)

## 2019-10-16 LAB — BILIRUBIN TOTAL: Bilirubin Total: 0.9 mg/dL — NL (ref 0.2–1.2)

## 2019-10-16 LAB — HAPPY TOGETHER UNMAPPED RESULTS: COVID-19 Results (Outside Organization): NOT DETECTED — NL

## 2019-10-17 LAB — HEMOGLOBIN AND HEMATOCRIT (OUTSIDE ORGANIZATION)
Hematocrit: 27 % — ABNORMAL LOW (ref 34.0–46.0)
Hemoglobin: 8.7 g/dL — ABNORMAL LOW (ref 11.5–15.0)

## 2019-10-17 LAB — MAGNESIUM (MG): Magnesium (Mg): 2.2 mg/dL — NL (ref 1.7–2.3)

## 2019-10-17 LAB — CHEM 7 (NA, K, CL, CO2, BUN, GLUC, CR) (OUTSIDE ORGANIZATION)
Anion Gap: 9 meq/L — NL (ref 5–16)
CO2: 24 meq/L — NL (ref 24–33)
Chloride: 100 meq/L — NL (ref 100–111)
Glucose: 272 mg/dL — ABNORMAL HIGH (ref 60–159)
Potassium: 3.4 meq/L — ABNORMAL LOW (ref 3.5–5.3)
Sodium: 133 meq/L — ABNORMAL LOW (ref 135–145)
Urea Nitrogen, Blood (BUN): 37 mg/dL — ABNORMAL HIGH (ref 7–27)

## 2019-10-17 LAB — CBC WITH DIFFERENTIAL
Hematocrit: 25.8 % — ABNORMAL LOW (ref 34.0–46.0)
Hemoglobin: 8.5 g/dL — ABNORMAL LOW (ref 11.5–15.0)
MCV: 85 fL — NL (ref 80–100)
Platelets count: 223 10*3/uL — NL (ref 140–400)
RDW, RBC: 13.1 % — NL (ref 12.0–16.5)
Red blood cells count: 3.04 M/uL — ABNORMAL LOW (ref 3.60–5.70)
WBC Count: 8.2 10*3/uL — NL (ref 3.7–11.1)

## 2019-10-17 LAB — WBC AUTO DIFF (OUTSIDE ORGANIZATION)
Basophils % Auto: 0 % — NL (ref 0–1)
Eosinophils % Auto: 0 % — NL (ref 0–7)
Immature Granulocytes % Auto: 0 % — NL (ref 0–1)
Lymphocytes % Auto: 26 % — NL (ref 15–47)
Monocytes % Auto: 9 % — NL (ref 5–13)
Neutrophils % Auto: 65 % — NL (ref 42–76)
Neutrophils, Automated Count: 5.4 10*3/uL — NL (ref 1.8–7.9)

## 2019-10-17 LAB — SODIUM: Sodium: 132 meq/L — ABNORMAL LOW (ref 135–145)

## 2019-10-18 LAB — WBC AUTO DIFF (OUTSIDE ORGANIZATION)
Basophils % Auto: 0 % — NL (ref 0–1)
Eosinophils % Auto: 0 % — NL (ref 0–7)
Immature Granulocytes % Auto: 0 % — NL (ref 0–1)
Lymphocytes % Auto: 28 % — NL (ref 15–47)
Monocytes % Auto: 8 % — NL (ref 5–13)
Neutrophils % Auto: 64 % — NL (ref 42–76)
Neutrophils, Automated Count: 5 10*3/uL — NL (ref 1.8–7.9)

## 2019-10-18 LAB — CBC WITH DIFFERENTIAL
Hematocrit: 26 % — ABNORMAL LOW (ref 34.0–46.0)
Hemoglobin: 8.5 g/dL — ABNORMAL LOW (ref 11.5–15.0)
MCV: 85 fL — NL (ref 80–100)
Platelets count: 230 10*3/uL — NL (ref 140–400)
RDW, RBC: 12.8 % — NL (ref 12.0–16.5)
Red blood cells count: 3.05 M/uL — ABNORMAL LOW (ref 3.60–5.70)
WBC Count: 7.8 10*3/uL — NL (ref 3.7–11.1)

## 2019-10-18 LAB — CHEM 7 (NA, K, CL, CO2, BUN, GLUC, CR) (OUTSIDE ORGANIZATION)
Anion Gap: 10 meq/L — NL (ref 5–16)
CO2: 23 meq/L — ABNORMAL LOW (ref 24–33)
Chloride: 98 meq/L — ABNORMAL LOW (ref 100–111)
Glucose: 237 mg/dL — ABNORMAL HIGH (ref 60–159)
Potassium: 3.7 meq/L — NL (ref 3.5–5.3)
Sodium: 131 meq/L — ABNORMAL LOW (ref 135–145)
Urea Nitrogen, Blood (BUN): 41 mg/dL — ABNORMAL HIGH (ref 7–27)

## 2019-10-18 LAB — MAGNESIUM (MG): Magnesium (Mg): 2.1 mg/dL — NL (ref 1.7–2.3)

## 2019-10-19 LAB — IRON AND TOTAL IRON BINDING CAPACITY (OUTSIDE ORGANIZATION)
Iron Percent Saturation: 20 % — NL (ref 14–57)
Iron Total: 46 ug/dL — ABNORMAL LOW (ref 50–212)
Iron binding capacity, unsaturated: 182 ug/dL — NL (ref 110–370)
Total Iron Binding Capacity: 228 ug/dL — NL (ref 228–428)

## 2019-10-19 LAB — FERRITIN: Ferritin: 205 ng/mL — NL (ref 22–291)

## 2019-10-19 LAB — CHEM 7 (NA, K, CL, CO2, BUN, GLUC, CR) (OUTSIDE ORGANIZATION)
Anion Gap: 8 meq/L — NL (ref 5–16)
CO2: 24 meq/L — NL (ref 24–33)
Chloride: 99 meq/L — ABNORMAL LOW (ref 100–111)
Glucose: 236 mg/dL — ABNORMAL HIGH (ref 60–159)
Potassium: 3.8 meq/L — NL (ref 3.5–5.3)
Sodium: 131 meq/L — ABNORMAL LOW (ref 135–145)
Urea Nitrogen, Blood (BUN): 39 mg/dL — ABNORMAL HIGH (ref 7–27)

## 2019-10-19 LAB — WBC AUTO DIFF (OUTSIDE ORGANIZATION)
Basophils % Auto: 0 % — NL (ref 0–1)
Eosinophils % Auto: 0 % — NL (ref 0–7)
Immature Granulocytes % Auto: 0 % — NL (ref 0–1)
Lymphocytes % Auto: 38 % — NL (ref 15–47)
Monocytes % Auto: 7 % — NL (ref 5–13)
Neutrophils % Auto: 54 % — NL (ref 42–76)
Neutrophils, Automated Count: 3.1 10*3/uL — NL (ref 1.8–7.9)

## 2019-10-19 LAB — CBC WITH DIFFERENTIAL
Hematocrit: 26.8 % — ABNORMAL LOW (ref 34.0–46.0)
Hemoglobin: 8.8 g/dL — ABNORMAL LOW (ref 11.5–15.0)
MCV: 87 fL — NL (ref 80–100)
Platelets count: 253 10*3/uL — NL (ref 140–400)
RDW, RBC: 13 % — NL (ref 12.0–16.5)
Red blood cells count: 3.09 M/uL — ABNORMAL LOW (ref 3.60–5.70)
WBC Count: 5.8 10*3/uL — NL (ref 3.7–11.1)

## 2019-10-20 LAB — VITAMIN D, 25 HYDROXY: 25-Oh Vitamin D2 And D3,Total: 9 ng/mL — ABNORMAL LOW (ref 20–79)

## 2019-10-22 LAB — METANEPHRINES FRACTIONATED BY HPLC-MS/MS, URINE: Creatinine,24 Hr Urine: 1522 mg/{total_volume} — NL (ref 630–2500)

## 2019-10-29 LAB — CBC WITH DIFFERENTIAL
Hematocrit: 39.6 % — NL (ref 34.0–46.0)
Hemoglobin: 12.7 g/dL — NL (ref 11.5–15.0)
MCV: 90 fL — NL (ref 80–100)
Platelets count: 259 10*3/uL — NL (ref 140–400)
RDW, RBC: 13.7 % — NL (ref 12.0–16.5)
Red blood cells count: 4.4 M/uL — NL (ref 3.60–5.70)
WBC Count: 6 10*3/uL — NL (ref 3.7–11.1)

## 2019-10-29 LAB — CHEM 7 (NA, K, CL, CO2, BUN, GLUC, CR) (OUTSIDE ORGANIZATION)
Anion Gap: 9 meq/L — NL (ref 5–16)
CO2: 28 meq/L — NL (ref 24–33)
Chloride: 104 meq/L — NL (ref 100–111)
Glucose: 90 mg/dL — NL (ref 60–159)
Potassium: 4.2 meq/L — NL (ref 3.5–5.3)
Sodium: 141 meq/L — NL (ref 135–145)
Urea Nitrogen, Blood (BUN): 26 mg/dL — NL (ref 7–27)

## 2019-10-29 LAB — WBC AUTO DIFF (OUTSIDE ORGANIZATION)
Basophils % Auto: 0 % — NL (ref 0–1)
Eosinophils % Auto: 0 % — NL (ref 0–7)
Immature Granulocytes % Auto: 0 % — NL (ref 0–1)
Lymphocytes % Auto: 20 % — NL (ref 15–47)
Monocytes % Auto: 4 % — ABNORMAL LOW (ref 5–13)
Neutrophils % Auto: 77 % — ABNORMAL HIGH (ref 42–76)
Neutrophils, Automated Count: 4.6 10*3/uL — NL (ref 1.8–7.9)

## 2019-11-01 LAB — BLOOD CULTURE (OUTSIDE ORGANIZATION)

## 2019-11-05 LAB — WBC AUTO DIFF (OUTSIDE ORGANIZATION)
Basophils % Auto: 1 % — NL (ref 0–1)
Eosinophils % Auto: 1 % — NL (ref 0–7)
Immature Granulocytes % Auto: 0 % — NL (ref 0–1)
Lymphocytes % Auto: 23 % — NL (ref 15–47)
Monocytes % Auto: 7 % — NL (ref 5–13)
Neutrophils % Auto: 68 % — NL (ref 42–76)
Neutrophils, Automated Count: 4.1 10*3/uL — NL (ref 1.8–7.9)

## 2019-11-05 LAB — CBC NO DIFFERENTIAL
Hematocrit: 27.2 % — ABNORMAL LOW (ref 34.0–46.0)
Hemoglobin: 8.6 g/dL — ABNORMAL LOW (ref 11.5–15.0)
MCV: 90 fL — NL (ref 80–100)
Platelets count: 251 10*3/uL — NL (ref 140–400)
RDW, RBC: 12.6 % — NL (ref 12.0–16.5)
Red blood cells count: 3.01 M/uL — ABNORMAL LOW (ref 3.60–5.70)
WBC Count: 6 10*3/uL — NL (ref 3.7–11.1)

## 2019-11-06 LAB — CARBON DIOXIDE TOTAL: CO2: 23 meq/L — ABNORMAL LOW (ref 24–33)

## 2019-11-06 LAB — CHLORIDE: Chloride: 104 meq/L — NL (ref 100–111)

## 2019-11-06 LAB — IRON AND TOTAL IRON BINDING CAPACITY (OUTSIDE ORGANIZATION)
Iron Percent Saturation: 18 % — NL (ref 14–57)
Iron Total: 49 ug/dL — ABNORMAL LOW (ref 50–212)
Iron binding capacity, unsaturated: 217 ug/dL — NL (ref 110–370)
Total Iron Binding Capacity: 266 ug/dL — NL (ref 228–428)

## 2019-11-06 LAB — POTASSIUM: Potassium: 4.4 meq/L — NL (ref 3.5–5.3)

## 2019-11-06 LAB — CALCIUM: Calcium: 8 mg/dL — ABNORMAL LOW (ref 8.8–10.5)

## 2019-11-06 LAB — THYROID STIMULATING HORMONE: Thyroid Stimulating Hormone: 4.4 u[IU]/mL — NL (ref 0.4–4.5)

## 2019-11-06 LAB — SODIUM: Sodium: 137 meq/L — NL (ref 135–145)

## 2019-11-06 LAB — PHOSPHORUS (PO4): Phosphorus (PO4): 4 mg/dL — NL (ref 2.7–4.5)

## 2019-11-06 LAB — GLUCOSE: Glucose: 218 mg/dL — ABNORMAL HIGH (ref 60–159)

## 2019-11-06 LAB — UREA NITROGEN, BLOOD (BUN): Urea Nitrogen, Blood (BUN): 28 mg/dL — ABNORMAL HIGH (ref 7–27)

## 2019-11-07 LAB — HEMOGLOBIN A1C: Hgb A1C,Glucose Est Avg: 240 mg/dL — ABNORMAL HIGH (ref 85–126)

## 2019-12-05 LAB — HAPPY TOGETHER UNMAPPED RESULTS: COVID-19 Results (Outside Organization): NOT DETECTED — NL

## 2020-01-07 ENCOUNTER — Ambulatory Visit: Payer: 344 | Attending: Family Medicine

## 2020-01-07 ENCOUNTER — Other Ambulatory Visit: Payer: Self-pay | Admitting: Family Medicine

## 2020-01-07 DIAGNOSIS — Z23 Encounter for immunization: Secondary | ICD-10-CM

## 2020-01-07 NOTE — Progress Notes (Signed)
Patient WAS wearing a surgical mask  Contact, Droplet and Airborne precautions were followed when caring for the patient.   PPE used by provider during encounter: Surgical mask, Face Shield/Goggles and Gloves    The patient is eligible to receive the COVID-19 Vaccine at this time: Yes    The patient has received a COVID vaccine previously: No      The patient acknowledges receipt of the Fact Sheet for Recipients and Caregivers for the Moderna vaccine, which includes information about the risks and benefits of the vaccine and available alternatives. The patient was informed the FDA has authorized the emergency use of the COVID-19 Vaccine, which is not an FDA-approved vaccine and that the patient may accept or refuse the vaccine.    Pre-Screening Documentation:      COVID-19 Immunization Allergies  1. Have you ever had a severe allergic reaction to a previous dose of the COVID-19 Vaccine or to any ingredient of the COVID-19 Vaccine? (Review ingredients in the Fact Sheet for Recipients and Caregivers): No  2. Have you ever had an immediate allergic reaction of any severity within 4 hours of receiving a previous dose of the COVID-19 Vaccine or any of its ingredients?: No  3. Have you ever had an immediate allergic reaction of any severity to polysorbate or polyethylene glycol [PEG]?: No  Did the patient answer yes to any of questions 1-3?: No (proceed to question 4)      COVID-19 Immunization Clinical Considerations  4.Are you pregnant or breastfeeding?: No  5.Do you have a weakened immune system caused by something such as HIV infection or cancer, or do you take medications that affect the immune system?: No  6. Do you have a bleeding disorder or are you taking blood thinners? : No  Did the patient answer yes to any of questions 4-6? : No (proceed to question 7)      COVID-19 Immunization Deferral Considerations  7. Have you received any other vaccines within the past 14 days? : No  8. Have you tested positive for  COVID-19 in the last 14 days? : No  9. Have you received a monoclonal antibody or convalescent plasma treatment for COVID-19 in the past 90 days? : No  10. Have you had a confirmed COVID-19 exposure in the past 14 days?: No  11. Are you feeling sick today? : No  Did the patient answer yes to any of questions 7-11?: No (proceed to question 12)      COVID-19 Immunization Dermal Filler Consideration  12. Have you received a dermal filler injection?: No (proceed to question 13)      COVID-19 Immunization Observation Considerations  13. Do you have a history of immediate allergic reaction of any severity to a vaccine?: No  14. Do you have a history of immediate allergic reaction of any severity to an injectable medication?: No  15. Do you have a history of severe allergic reaction due to any cause? : No  Did the patient answer yes to any of questions 13-15? : No  If no, the patient was informed a 15-minute observation period is recommended after the vaccine to watch for a reaction.: Confirmed        All patient questions were answered and the patient agrees to receive the COVID-19 vaccine today.     Vaccine prepared and administered according to the current Emergency Use Authorization and Fact Sheet for Healthcare Providers Administering Vaccine.    Patient given vaccine card and discharge instructions with   information about the Fact Sheet for Recipients and Caregivers and the v-safe program. Patient directed to observation area.     Reyhan Moronta, RN

## 2020-01-08 ENCOUNTER — Ambulatory Visit: Payer: Self-pay

## 2020-01-22 IMAGING — NM NUCLEAR MEDICINE HEPATOBILIARY INCLUDE GB
2 series · 12 of 12 positions shown · non-contrast
Comparison: Ultrasound abdomen 06/26/2019, 06/12/2019

CLINICAL DATA: RIGHT upper quadrant pain with nausea and vomiting
for 6 months

EXAM:
NUCLEAR MEDICINE HEPATOBILIARY IMAGING
TECHNIQUE: Sequential images of the abdomen were obtained [DATE] minutes
following intravenous administration of radiopharmaceutical.
RADIOPHARMACEUTICALS:  4.7 mCi Lc-00m  Choletec IV

[Series 1: biliary · 3.25mm/px · 6 of 59 frames shown]
[frame 5/59]
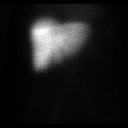
[frame 15/59]
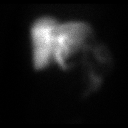
[frame 25/59]
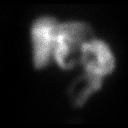
[frame 35/59]
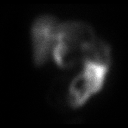
[frame 45/59]
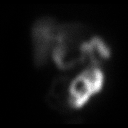
[frame 55/59]
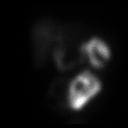

[Series 2: anterior · 3.25mm/px · 6 of 30 frames shown]
[frame 3/30]
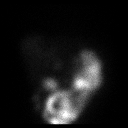
[frame 8/30]
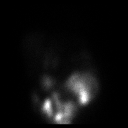
[frame 13/30]
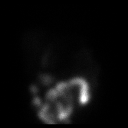
[frame 18/30]
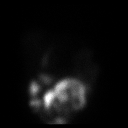
[frame 23/30]
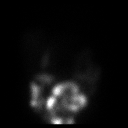
[frame 28/30]
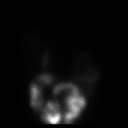

[12 of 12 positions shown; findings below may reference images not displayed]

FINDINGS: Prompt clearance of tracer from bloodstream indicating normal
hepatocellular function.

Bowel tracer visualized at 11 minutes.

At 1 hour, gallbladder had not visualized.

Patient received 3.5 mg of morphine IV and imaging was continued for
30 minutes.

Faint visualization of gallbladder following following morphine
indicating patent cystic duct.

No significant hepatic retention of tracer at conclusion of exam.
IMPRESSION: Delayed and faint visualization of the gallbladder, seen only
following morphine administration, indicating patency of the cystic
duct.

Findings could be seen in the setting of chronic cholecystitis.

## 2020-02-05 ENCOUNTER — Ambulatory Visit: Payer: 344 | Attending: Family Medicine

## 2020-02-05 DIAGNOSIS — Z23 Encounter for immunization: Secondary | ICD-10-CM

## 2020-02-05 NOTE — Progress Notes (Signed)
Patient WAS wearing a surgical mask  Airborne precautions were followed when caring for the patient.   PPE used by provider during encounter: Surgical mask, Face Shield/Goggles and Gloves     The patient is eligible to receive the COVID-19 vaccine at this time: Yes    The patient has received a COVID vaccine previously: Yes   Immunization History   Administered Date(s) Administered    COVID-19 mRNA PF 100 mcg/0.5 mL (Moderna)   01/07/2020            The patient acknowledges receipt of the Fact Sheet for Recipients and Caregivers for the Moderna vaccine, which includes information about the risks and benefits of the vaccine and available alternatives. The patient was informed the FDA has authorized the emergency use of the COVID-19 Vaccine, which is not an FDA-approved vaccine and that the patient may accept or refuse the vaccine.    Pre-Screening Documentation:          COVID-19 Immunization Allergies  1. Have you ever had a severe allergic reaction to a previous dose of the COVID-19 Vaccine or to any ingredient of the COVID-19 Vaccine? (Review ingredients in the Fact Sheet for Recipients and Caregivers): No  2. Have you ever had an immediate allergic reaction of any severity within 4 hours of receiving a previous dose of the COVID-19 Vaccine or any of its ingredients?: No  3. Have you ever had an immediate allergic reaction of any severity to polysorbate or polyethylene glycol [PEG]?: No  Did the patient answer yes to any of questions 1-3?: No (proceed to question 4)      COVID-19 Immunization Clinical Considerations  4.Are you pregnant or breastfeeding?: No  5.Do you have a weakened immune system caused by something such as HIV infection or cancer, or do you take medications that affect the immune system?: Yes  If yes, please explain (health conditions, medications, etc): DAIBETES  6. Do you have a bleeding disorder or are you taking blood thinners? : No  Did the patient answer yes to any of questions 4-6? :  Yes  If yes, the patient was informed this is not a contraindication to receiving the vaccine, but it is recommended the patient discuss the risks and benefits of the vaccine with their physician.: The patient acknowledges the risk and desires to receive the vaccine today.      COVID-19 Immunization Deferral Considerations  7. Have you received any other vaccines within the past 14 days? : No  8. Have you had a confirmed COVID-19 exposure in the past 14 days?: No  9. Have you tested positive for COVID-19 in the last 14 days? : No  10. Are you feeling sick today? : No  Did the patient answer yes to any of questions 7-10?: No (proceed to question 11)       COVID-19 Immunization Antibody Deferral Considerations  11. In the past 90 days, have you tested positive for COVID-19 and recieved treatment?: No (proceed to question 12)       COVID-19 Immunization Dermal Filler Consideration  12. Have you received a dermal filler injection?: No (proceed to question 13)      COVID-19 Immunization Observation Considerations  13. Do you have a history of immediate allergic reaction of any severity to a vaccine?: No  14. Do you have a history of immediate allergic reaction of any severity to an injectable medication?: No  15. Do you have a history of severe allergic reaction due to any cause? :  No  Did the patient answer yes to any of questions 13-15? : No  If no, the patient was informed a 15-minute observation period is recommended after the vaccine to watch for a reaction.: Confirmed        All patient questions were answered and the patient agrees to receive the COVID-19 vaccine today.     Vaccine prepared and administered according to the current Emergency Use Authorization and Fact Sheet for Healthcare Providers Administering Vaccine.    Patient given vaccine card and discharge instructions with information about the Fact Sheet for Recipients and Caregivers and the v-safe program. Patient directed to observation area.     Alexandria Lodge, RN

## 2020-03-03 LAB — CHEM 7 (NA, K, CL, CO2, BUN, GLUC, CR) (OUTSIDE ORGANIZATION)
Anion Gap: 24 mEq/L — ABNORMAL HIGH (ref 5–16)
Anion Gap: 9 mEq/L (ref 5–16)
CO2: 16 mEq/L — ABNORMAL LOW (ref 24–33)
CO2: 22 mEq/L — ABNORMAL LOW (ref 24–33)
Chloride: 97 mEq/L — ABNORMAL LOW (ref 100–111)
Chloride: 103 mEq/L (ref 100–111)
Creatinine Blood: 2.85 mg/dL — ABNORMAL HIGH (ref ?–1.11)
Creatinine Blood: 2.62 mg/dL — ABNORMAL HIGH (ref ?–1.11)
Glomerular Filtration Rate - African American: 20 mL/min — ABNORMAL LOW (ref 60–?)
Glomerular Filtration Rate - African American: 20 mL/min — ABNORMAL LOW (ref 60–?)
Glomerular filtration rate, nonAfrican American: 17 mL/min — ABNORMAL LOW (ref 60–?)
Glucose: 561 mg/dL — CR (ref 60–159)
Glucose: 372 mg/dL — ABNORMAL HIGH (ref 60–159)
Potassium: 3.8 mEq/L (ref 3.5–5.3)
Potassium: 4.2 mEq/L (ref 3.5–5.3)
Sodium: 137 mEq/L (ref 135–145)
Sodium: 134 mEq/L — ABNORMAL LOW (ref 135–145)
Urea Nitrogen, Blood (BUN): 35 mg/dL — ABNORMAL HIGH (ref 7–27)
Urea Nitrogen, Blood (BUN): 35 mg/dL — ABNORMAL HIGH (ref 7–27)

## 2020-03-03 LAB — CBC WITH DIFFERENTIAL
Hematocrit: 29.9 % — ABNORMAL LOW (ref 34.0–46.0)
Hemoglobin: 9.5 g/dL — ABNORMAL LOW (ref 11.5–15.0)
MCV: 88 fL (ref 80–100)
Platelets count: 284 10*3/uL (ref 140–400)
RBC's, nucleated: 0 /100{WBCs} (ref ?–0)
RDW, RBC: 13.1 % (ref 12.0–16.5)
Red blood cells count: 3.4 M/uL — ABNORMAL LOW (ref 3.60–5.70)
WBC Count: 11.1 10*3/uL (ref 3.7–11.1)

## 2020-03-03 LAB — URINALYSIS W/MICROSCOPIC
RBC, urine: >182 /HPF — ABNORMAL HIGH (ref 0–3)
Squamous cells count, ur sed, Qn: 335 /LPF — ABNORMAL HIGH (ref 0–5)
WBC, urine: 82 /HPF — ABNORMAL HIGH (ref 0–5)

## 2020-03-03 LAB — BLD GAS VENOUS
Base Excess, Ven: -3.3 mmol/L — ABNORMAL LOW (ref <=2.3)
HCO3, Ven: 21.3 mmol/L — ABNORMAL LOW (ref 23.0–28.0)
O2 Sat, Ven: 98.1 %
Oxygen, partial pressure, venous: 116 mmHg — ABNORMAL HIGH (ref 35–45)
PCO2, Ven: 40 mmHg (ref 38–50)
pH, VEN: 7.35 (ref 7.32–7.43)

## 2020-03-03 LAB — WBC AUTO DIFF (OUTSIDE ORGANIZATION)
Basophils % Auto: 0 % (ref 0–1)
Eosinophils % Auto: 0 % (ref 0–7)
Immature Granulocytes % Auto: 0 % (ref 0–1)
Lymphocytes % Auto: 42 % (ref 15–47)
Monocytes % Auto: 7 % (ref 5–13)
Neutrophils % Auto: 50 % (ref 42–76)
Neutrophils, Automated Count: 5.5 10*3/uL (ref 1.8–7.9)

## 2020-03-03 LAB — HAPPY TOGETHER UNMAPPED RESULTS: COVID-19 Results (Outside Organization): NOT DETECTED

## 2020-03-03 LAB — ALKALINE PHOSPHATASE (ALP): Alkaline Phosphatase (ALP): 114 U/L (ref 37–117)

## 2020-03-03 LAB — MAGNESIUM (MG): Magnesium (Mg): 1.4 mg/dL — ABNORMAL LOW (ref 1.7–2.3)

## 2020-03-03 LAB — BILIRUBIN TOTAL: Bilirubin Total: 0.6 mg/dL (ref 0.2–1.2)

## 2020-03-03 LAB — LIPASE: Lipase: 153 U/L — ABNORMAL HIGH (ref ?–90)

## 2020-03-03 LAB — TROPONIN I: Troponin I: 0.03 ng/mL (ref 0.00–0.04)

## 2020-03-03 LAB — PHOSPHORUS (PO4): Phosphorus (PO4): 4.1 mg/dL (ref 2.7–4.5)

## 2020-03-03 LAB — CALCIUM: Calcium: 8.8 mg/dL (ref 8.8–10.5)

## 2020-03-03 LAB — ALANINE TRANSFERASE (ALT): Alanine Transferase (ALT): 16 U/L (ref 0–41)

## 2020-03-03 LAB — ASPARTATE TRANSAMINASE (AST): Aspartate Transaminase (AST): 19 U/L (ref 10–40)

## 2020-03-04 LAB — CHEM 7 (NA, K, CL, CO2, BUN, GLUC, CR) (OUTSIDE ORGANIZATION)
Anion Gap: 8 mEq/L (ref 5–16)
Anion Gap: 9 mEq/L (ref 5–16)
CO2: 22 mEq/L — ABNORMAL LOW (ref 24–33)
CO2: 22 mEq/L — ABNORMAL LOW (ref 24–33)
Chloride: 109 mEq/L (ref 100–111)
Chloride: 110 mEq/L (ref 100–111)
Creatinine Blood: 2.43 mg/dL — ABNORMAL HIGH (ref ?–1.11)
Creatinine Blood: 2.21 mg/dL — ABNORMAL HIGH (ref ?–1.11)
Glucose: 159 mg/dL (ref 60–159)
Glucose: 139 mg/dL (ref 60–159)
Potassium: 3.9 mEq/L (ref 3.5–5.3)
Potassium: 4 mEq/L (ref 3.5–5.3)
Sodium: 139 mEq/L (ref 135–145)
Sodium: 141 mEq/L (ref 135–145)
Urea Nitrogen, Blood (BUN): 31 mg/dL — ABNORMAL HIGH (ref 7–27)
Urea Nitrogen, Blood (BUN): 29 mg/dL — ABNORMAL HIGH (ref 7–27)

## 2020-03-04 LAB — LIPID PANEL, NON-FASTING (CHOL, DHDL, TRIG, CALC LDL W REFLEX TO DLDL) (OUTSIDE ORGANIZATION)
Cholesterol: 225 mg/dL (ref ?–239)
HDL Cholesterol: 70 mg/dL (ref 50–?)
LDL Cholesterol Calculation: 135 mg/dL (ref ?–159)
Triglyceride, Nonfasting: 98 mg/dL (ref ?–879)

## 2020-03-04 LAB — LIPASE: Lipase: 154 U/L — ABNORMAL HIGH (ref ?–90)

## 2020-03-04 LAB — WBC AUTO DIFF (OUTSIDE ORGANIZATION)
Basophils % Auto: 0 % (ref 0–1)
Eosinophils % Auto: 0 % (ref 0–7)
Immature Granulocytes % Auto: 1 % (ref 0–1)
Lymphocytes % Auto: 26 % (ref 15–47)
Monocytes % Auto: 6 % (ref 5–13)
Neutrophils % Auto: 68 % (ref 42–76)
Neutrophils, Automated Count: 5.8 10*3/uL (ref 1.8–7.9)

## 2020-03-04 LAB — CBC WITH DIFFERENTIAL
Hematocrit: 24.7 % — ABNORMAL LOW (ref 34.0–46.0)
Hemoglobin: 8.1 g/dL — ABNORMAL LOW (ref 11.5–15.0)
MCV: 85 fL (ref 80–100)
Platelets count: 224 10*3/uL (ref 140–400)
RBC's, nucleated: 0 /100{WBCs} (ref ?–0)
RDW, RBC: 13.1 % (ref 12.0–16.5)
Red blood cells count: 2.92 M/uL — ABNORMAL LOW (ref 3.60–5.70)
WBC Count: 8.6 10*3/uL (ref 3.7–11.1)

## 2020-03-04 LAB — BLD GAS VENOUS
Base Excess, Ven: -3 mmol/L — ABNORMAL LOW (ref <=2.3)
HCO3, Ven: 21.7 mmol/L — ABNORMAL LOW (ref 23.0–28.0)
O2 Sat, Ven: 82.4 %
Oxygen, partial pressure, venous: 51 mmHg — ABNORMAL HIGH (ref 35–45)
PCO2, Ven: 40 mmHg (ref 38–50)
pH, VEN: 7.35 (ref 7.32–7.43)

## 2020-03-04 LAB — ALKALINE PHOSPHATASE (ALP): Alkaline Phosphatase (ALP): 95 U/L (ref 37–117)

## 2020-03-04 LAB — ALANINE TRANSFERASE (ALT): Alanine Transferase (ALT): 12 U/L (ref 0–41)

## 2020-03-04 LAB — MAGNESIUM (MG): Magnesium (Mg): 1.7 mg/dL (ref 1.7–2.3)

## 2020-03-04 LAB — CALCIUM: Calcium: 8 mg/dL — ABNORMAL LOW (ref 8.8–10.5)

## 2020-03-04 LAB — BILIRUBIN TOTAL: Bilirubin Total: 0.4 mg/dL (ref 0.2–1.2)

## 2020-03-04 LAB — PHOSPHORUS (PO4): Phosphorus (PO4): 3.3 mg/dL (ref 2.7–4.5)

## 2020-03-04 LAB — ASPARTATE TRANSAMINASE (AST): Aspartate Transaminase (AST): 16 U/L (ref 10–40)

## 2020-03-05 LAB — CULTURE URINE, BACTI

## 2020-03-06 LAB — VITAMIN D, 25 HYDROXY: 25-Oh Vitamin D2 And D3,Total: 30 ng/mL (ref 20–79)

## 2020-03-26 LAB — PROTEIN/CREATININE RATIO, URINE (OUTSIDE ORGANIZATION)
Creatinine, Urine: 152 mg/dL
Creatinine, Urine: 152 mg/dL
Protein Total Spot Urine: 371.6 mg/dL
Protein Total Spot Urine: 372.3 mg/dL
Protein/Creatinine Ratio mg/mg: 2.44 mg/mg — ABNORMAL HIGH (ref ?–0.19)
Protein/Creatinine Ratio mg/mg: 2.45 mg/mg — ABNORMAL HIGH (ref ?–0.19)

## 2020-03-26 LAB — CBC NO DIFFERENTIAL
Hematocrit: 26.9 % — ABNORMAL LOW (ref 34.0–46.0)
Hemoglobin: 8.4 g/dL — ABNORMAL LOW (ref 11.5–15.0)
MCV: 94 fL (ref 80–100)
Platelets count: 225 10*3/uL (ref 140–400)
RBC's, nucleated: 0 /100{WBCs} (ref ?–0)
RDW, RBC: 13.2 % (ref 12.0–16.5)
Red blood cells count: 2.87 M/uL — ABNORMAL LOW (ref 3.60–5.70)
WBC Count: 6.5 10*3/uL (ref 3.7–11.1)

## 2020-03-26 LAB — CALCIUM
Calcium: 8.3 mg/dL — ABNORMAL LOW (ref 8.8–10.5)
Calcium: 8.7 mg/dL — ABNORMAL LOW (ref 8.8–10.5)

## 2020-03-26 LAB — ALBUMIN: Albumin: 3.4 g/dL — ABNORMAL LOW (ref 3.7–5.7)

## 2020-03-26 LAB — WBC AUTO DIFF (OUTSIDE ORGANIZATION)
Basophils % Auto: 0 % (ref 0–1)
Eosinophils % Auto: 0 % (ref 0–7)
Immature Granulocytes % Auto: 0 % (ref 0–1)
Lymphocytes % Auto: 26 % (ref 15–47)
Monocytes % Auto: 8 % (ref 5–13)
Neutrophils % Auto: 66 % (ref 42–76)
Neutrophils, Automated Count: 4.3 10*3/uL (ref 1.8–7.9)

## 2020-03-26 LAB — HAPPY TOGETHER UNMAPPED RESULTS: COVID-19 Results (Outside Organization): NOT DETECTED

## 2020-03-26 LAB — CREATININE/E-GFR
Creatinine Blood: 2.39 mg/dL — ABNORMAL HIGH (ref ?–1.11)
Creatinine Blood: 2.43 mg/dL — ABNORMAL HIGH (ref ?–1.11)
Glomerular Filtration Rate - African American: 24 mL/min — ABNORMAL LOW (ref 60–?)
Glomerular Filtration Rate - African American: 25 mL/min — ABNORMAL LOW (ref 60–?)
Glomerular filtration rate, nonAfrican American: 21 mL/min — ABNORMAL LOW (ref 60–?)
Glomerular filtration rate, nonAfrican American: 21 mL/min — ABNORMAL LOW (ref 60–?)

## 2020-03-26 LAB — IRON AND TOTAL IRON BINDING CAPACITY (OUTSIDE ORGANIZATION)
Iron Percent Saturation: 18 % (ref 14–57)
Iron Total: 49 ug/dL — ABNORMAL LOW (ref 50–212)
Iron binding capacity, unsaturated: 220 ug/dL (ref 110–370)
Total Iron Binding Capacity: 269 ug/dL (ref 228–428)

## 2020-03-26 LAB — POTASSIUM
Potassium: 4.4 mEq/L (ref 3.5–5.3)
Potassium: 4.4 mEq/L (ref 3.5–5.3)

## 2020-03-26 LAB — PROTEIN TOTAL SPOT URINE: Protein Total Spot Urine: 371.6 mg/dL

## 2020-03-26 LAB — PHOSPHORUS (PO4): Phosphorus (PO4): 4.8 mg/dL — ABNORMAL HIGH (ref 2.7–4.5)

## 2020-03-26 LAB — SODIUM: Sodium: 141 mEq/L (ref 135–145)

## 2020-03-26 LAB — PARATHYROID HORMONE INTACT: Parathyroid Hormone Intact: 216 pg/mL — ABNORMAL HIGH (ref 26–115)

## 2020-03-26 LAB — UREA NITROGEN, BLOOD (BUN): Urea Nitrogen, Blood (BUN): 37 mg/dL — ABNORMAL HIGH (ref 7–27)

## 2020-03-26 LAB — MICROALBUMIN

## 2020-03-27 LAB — HEMOGLOBIN A1C
Hgb A1C,Glucose Est Avg: 223 mg/dL — ABNORMAL HIGH (ref 85–126)
Hgb A1c %: 9.4 % — ABNORMAL HIGH (ref ?–5.6)

## 2020-03-27 LAB — FERRITIN: Ferritin: 103 ng/mL (ref 22–291)

## 2020-03-29 LAB — PROTEIN ELECTROPHORESIS,SERUM
Albumin: 3.3 g/dL — ABNORMAL LOW (ref 3.8–5.0)
Alpha-1-globulin, electrophoresis: 0.4 g/dL (ref 0.2–0.4)
Alpha-2-globulin, electrophoresis: 0.8 g/dL (ref 0.5–1.0)
Beta globulin, electrophoresis: 0.8 g/dL (ref 0.6–1.2)
Gamma globulin, electrophoresis: 1 g/dL (ref 0.7–1.8)
Protein: 6.3 g/dL (ref 6.0–7.7)
Protein: 6.3 g/dL (ref 6.0–7.7)

## 2020-05-16 LAB — COVID-19 (OUTSIDE ORGANIZATION): COVID-19 Results (Outside Organization): NOT DETECTED

## 2020-06-13 LAB — ELECTROLYTE PANEL
CO2: 22 mEq/L — ABNORMAL LOW (ref 24–33)
Chloride: 105 mEq/L (ref 100–111)
Potassium: 4.5 mEq/L (ref 3.5–5.3)
Sodium: 136 mEq/L (ref 135–145)

## 2020-06-13 LAB — HEMOGLOBIN AND HEMATOCRIT (OUTSIDE ORGANIZATION)
Hematocrit: 23.8 % — ABNORMAL LOW (ref 34.0–46.0)
Hemoglobin: 7.7 g/dL — ABNORMAL LOW (ref 11.5–15.0)

## 2020-06-13 LAB — CALCIUM: Calcium: 8.5 mg/dL — ABNORMAL LOW (ref 8.8–10.5)

## 2020-06-13 LAB — CREATININE/E-GFR
Creatinine Blood: 2.23 mg/dL — ABNORMAL HIGH (ref ?–1.11)
Glomerular Filtration Rate - African American: 27 mL/min — ABNORMAL LOW (ref 60–?)
Glomerular filtration rate, nonAfrican American: 23 mL/min — ABNORMAL LOW (ref 60–?)

## 2020-06-13 LAB — PARATHYROID HORMONE INTACT: Parathyroid Hormone Intact: 182 pg/mL — ABNORMAL HIGH (ref 26–115)

## 2020-06-13 LAB — PHOSPHORUS (PO4): Phosphorus (PO4): 3.6 mg/dL (ref 2.7–4.5)

## 2020-06-13 LAB — UREA NITROGEN, BLOOD (BUN): Urea Nitrogen, Blood (BUN): 29 mg/dL — ABNORMAL HIGH (ref 7–27)

## 2020-06-14 LAB — HEMOGLOBIN A1C
Hgb A1C,Glucose Est Avg: 160 mg/dL — ABNORMAL HIGH (ref 85–126)
Hgb A1c %: 7.2 % — ABNORMAL HIGH (ref ?–5.6)

## 2020-07-20 LAB — IRON AND TOTAL IRON BINDING CAPACITY (OUTSIDE ORGANIZATION)
Iron Percent Saturation: 86 % — ABNORMAL HIGH (ref 14–57)
Iron Total: 506 ug/dL — ABNORMAL HIGH (ref 50–212)
Iron binding capacity, unsaturated: 82 ug/dL — ABNORMAL LOW (ref 110–370)
Total Iron Binding Capacity: 588 ug/dL — ABNORMAL HIGH (ref 228–428)

## 2020-07-20 LAB — CREATININE/E-GFR
Creatinine Blood: 2.61 mg/dL — ABNORMAL HIGH (ref ?–1.11)
Glomerular Filtration Rate - African American: 22 mL/min — ABNORMAL LOW (ref 60–?)
Glomerular filtration rate, nonAfrican American: 19 mL/min — ABNORMAL LOW (ref 60–?)

## 2020-07-20 LAB — HEMOGLOBIN AND HEMATOCRIT (OUTSIDE ORGANIZATION)
Hematocrit: 26.2 % — ABNORMAL LOW (ref 34.0–46.0)
Hemoglobin: 8.5 g/dL — ABNORMAL LOW (ref 11.5–15.0)

## 2020-07-20 LAB — URINALYSIS W/MICROSCOPIC
RBC, urine: 2 /HPF (ref 0–3)
Squamous cells count, ur sed, Qn: 3 /LPF (ref 0–5)
WBC, urine: 1 /HPF (ref 0–5)

## 2020-07-20 LAB — ELECTROLYTE PANEL
CO2: 22 mEq/L — ABNORMAL LOW (ref 24–33)
Chloride: 104 mEq/L (ref 100–111)
Potassium: 4.3 mEq/L (ref 3.5–5.3)
Sodium: 135 mEq/L (ref 135–145)

## 2020-07-20 LAB — POTASSIUM: Potassium: 4.5 mEq/L (ref 3.5–5.3)

## 2020-07-20 LAB — FERRITIN: Ferritin: 146 ng/mL (ref 22–291)

## 2020-09-05 LAB — HEMOGLOBIN AND HEMATOCRIT (OUTSIDE ORGANIZATION)
Hematocrit: 27.8 % — ABNORMAL LOW (ref 34.0–46.0)
Hemoglobin: 8.7 g/dL — ABNORMAL LOW (ref 11.5–15.0)

## 2020-09-06 LAB — ELECTROLYTE PANEL
CO2: 21 mEq/L — ABNORMAL LOW (ref 24–33)
Chloride: 104 mEq/L (ref 100–111)
Potassium: 4.4 mEq/L (ref 3.5–5.3)
Sodium: 137 mEq/L (ref 135–145)

## 2020-09-06 LAB — IRON AND TOTAL IRON BINDING CAPACITY (OUTSIDE ORGANIZATION)
Iron Percent Saturation: 24 % (ref 14–57)
Iron Total: 58 ug/dL (ref 50–212)
Iron binding capacity, unsaturated: 183 ug/dL (ref 110–370)
Total Iron Binding Capacity: 241 ug/dL (ref 228–428)

## 2020-09-06 LAB — ALBUMIN: Albumin: 3.3 g/dL — ABNORMAL LOW (ref 3.7–5.7)

## 2020-09-06 LAB — CALCIUM: Calcium: 8.2 mg/dL — ABNORMAL LOW (ref 8.8–10.5)

## 2020-09-06 LAB — CREATININE/E-GFR
Creatinine Blood: 4.13 mg/dL — ABNORMAL HIGH (ref ?–1.11)
Glomerular Filtration Rate - African American: 13 mL/min — ABNORMAL LOW (ref 60–?)
Glomerular filtration rate, nonAfrican American: 11 mL/min — ABNORMAL LOW (ref 60–?)

## 2020-09-06 LAB — PHOSPHORUS (PO4): Phosphorus (PO4): 4.1 mg/dL (ref 2.7–4.5)

## 2020-09-06 LAB — FERRITIN: Ferritin: 339 ng/mL — ABNORMAL HIGH (ref 22–291)

## 2020-09-06 LAB — PARATHYROID HORMONE INTACT: Parathyroid Hormone Intact: 302 pg/mL — ABNORMAL HIGH (ref 26–115)

## 2020-09-06 LAB — UREA NITROGEN, BLOOD (BUN): Urea Nitrogen, Blood (BUN): 44 mg/dL — ABNORMAL HIGH (ref 7–27)

## 2020-09-07 LAB — HEMOGLOBIN A1C
Hgb A1C,Glucose Est Avg: 183 mg/dL — ABNORMAL HIGH (ref 85–126)
Hgb A1c %: 8 % — ABNORMAL HIGH (ref ?–5.6)

## 2020-10-03 LAB — CREATININE/E-GFR
Creatinine Blood: 3.48 mg/dL — ABNORMAL HIGH (ref ?–1.11)
Glomerular Filtration Rate - African American: 16 mL/min — ABNORMAL LOW (ref 60–?)
Glomerular filtration rate, nonAfrican American: 13 mL/min — ABNORMAL LOW (ref 60–?)

## 2020-10-03 LAB — ELECTROLYTE PANEL
CO2: 20 mEq/L — ABNORMAL LOW (ref 24–33)
Chloride: 108 mEq/L (ref 100–111)
Potassium: 4.3 mEq/L (ref 3.5–5.3)
Sodium: 138 mEq/L (ref 135–145)

## 2020-10-03 LAB — HEMOGLOBIN AND HEMATOCRIT (OUTSIDE ORGANIZATION)
Hematocrit: 26.6 % — ABNORMAL LOW (ref 34.0–46.0)
Hemoglobin: 8.1 g/dL — ABNORMAL LOW (ref 11.5–15.0)

## 2020-10-03 LAB — CALCIUM: Calcium: 7.4 mg/dL — ABNORMAL LOW (ref 8.8–10.5)

## 2021-03-08 LAB — HEMOGLOBIN AND HEMATOCRIT (OUTSIDE ORGANIZATION)
Hematocrit: 26 % — ABNORMAL LOW (ref 34.0–46.0)
Hemoglobin: 8.1 g/dL — ABNORMAL LOW (ref 11.5–15.0)

## 2021-03-09 LAB — CREATININE/E-GFR
Creatinine Blood: 4.32 mg/dL — ABNORMAL HIGH (ref ?–1.11)
Glomerular Filtration Rate - African American: 12 mL/min — ABNORMAL LOW (ref 60–?)
Glomerular filtration rate, nonAfrican American: 10 mL/min — ABNORMAL LOW (ref 60–?)

## 2021-03-09 LAB — CALCIUM: Calcium: 8.3 mg/dL — ABNORMAL LOW (ref 8.8–10.5)

## 2021-03-09 LAB — PROTEIN/CREATININE RATIO, URINE (OUTSIDE ORGANIZATION)
Creatinine, Urine: 98 mg/dL
Protein Total Spot Urine: 704 mg/dL
Protein/Creatinine Ratio mg/mg: 7.18 mg/mg — ABNORMAL HIGH (ref ?–0.19)

## 2021-03-09 LAB — UREA NITROGEN, BLOOD (BUN): Urea Nitrogen, Blood (BUN): 52 mg/dL — ABNORMAL HIGH (ref 7–27)

## 2021-03-09 LAB — ELECTROLYTE PANEL
CO2: 19 mEq/L — ABNORMAL LOW (ref 24–33)
Chloride: 107 mEq/L (ref 100–111)
Potassium: 4.9 mEq/L (ref 3.5–5.3)
Sodium: 136 mEq/L (ref 135–145)

## 2021-03-09 LAB — IRON AND TOTAL IRON BINDING CAPACITY (OUTSIDE ORGANIZATION)
Iron Percent Saturation: 23 % (ref 14–57)
Iron Total: 57 ug/dL (ref 50–212)
Iron binding capacity, unsaturated: 192 ug/dL (ref 110–370)
Total Iron Binding Capacity: 249 ug/dL (ref 228–428)

## 2021-03-09 LAB — MICROALBUMIN

## 2021-03-09 LAB — PHOSPHORUS (PO4): Phosphorus (PO4): 3.7 mg/dL (ref 2.7–4.5)

## 2021-03-09 LAB — ALBUMIN: Albumin: 3.4 g/dL — ABNORMAL LOW (ref 3.7–5.7)

## 2021-03-09 LAB — PARATHYROID HORMONE INTACT: Parathyroid Hormone Intact: 321 pg/mL — ABNORMAL HIGH (ref 26–115)

## 2021-03-09 LAB — FERRITIN: Ferritin: 251 ng/mL (ref 22–291)

## 2021-03-09 LAB — POTASSIUM: Potassium: 4.8 mEq/L (ref 3.5–5.3)

## 2021-03-10 LAB — HEMOGLOBIN A1C
Hgb A1C,Glucose Est Avg: 186 mg/dL — ABNORMAL HIGH (ref 85–126)
Hgb A1c %: 8.1 % — ABNORMAL HIGH (ref ?–5.6)

## 2021-04-12 LAB — URINALYSIS W/MICROSCOPIC
RBC, urine: 4 /HPF — ABNORMAL HIGH (ref 0–3)
Squamous cells count, ur sed, Qn: 8 /LPF — ABNORMAL HIGH (ref 0–5)
WBC, urine: 3 /HPF (ref 0–5)

## 2021-04-13 LAB — CBC NO DIFFERENTIAL
Hematocrit: 26.5 % — ABNORMAL LOW (ref 34.0–46.0)
Hemoglobin: 8.1 g/dL — ABNORMAL LOW (ref 11.5–15.0)
MCV: 98 fL (ref 80–100)
Platelets count: 272 10*3/uL (ref 140–400)
RBC's, nucleated: 0 /100{WBCs} (ref ?–0)
RDW, RBC: 12.5 % (ref 12.0–16.5)
Red blood cells count: 2.71 M/uL — ABNORMAL LOW (ref 3.60–5.70)
WBC Count: 10.6 10*3/uL (ref 3.7–11.1)

## 2021-04-13 LAB — PROTEIN/CREATININE RATIO, URINE (OUTSIDE ORGANIZATION)
Creatinine, Urine: 65 mg/dL
Protein Total Spot Urine: 398.9 mg/dL
Protein/Creatinine Ratio mg/mg: 6.14 mg/mg — ABNORMAL HIGH (ref ?–0.19)

## 2021-04-13 LAB — CALCIUM: Calcium: 7.9 mg/dL — ABNORMAL LOW (ref 8.8–10.5)

## 2021-04-13 LAB — CARBON DIOXIDE TOTAL: CO2: 18 mEq/L — ABNORMAL LOW (ref 24–33)

## 2021-04-13 LAB — HEMOGLOBIN AND HEMATOCRIT (OUTSIDE ORGANIZATION)
Hematocrit: 26.8 % — ABNORMAL LOW (ref 34.0–46.0)
Hemoglobin: 8.2 g/dL — ABNORMAL LOW (ref 11.5–15.0)

## 2021-04-13 LAB — POTASSIUM: Potassium: 4.6 mEq/L (ref 3.5–5.3)

## 2021-04-13 LAB — PHOSPHORUS (PO4): Phosphorus (PO4): 4.8 mg/dL — ABNORMAL HIGH (ref 2.7–4.5)

## 2021-04-13 LAB — GLUCOSE: Glucose: 71 mg/dL (ref 60–159)

## 2021-04-13 LAB — PARATHYROID HORMONE INTACT: Parathyroid Hormone Intact: 531 pg/mL — ABNORMAL HIGH (ref 26–115)

## 2021-04-13 LAB — CHLORIDE: Chloride: 107 mEq/L (ref 100–111)

## 2021-04-13 LAB — UREA NITROGEN, BLOOD (BUN): Urea Nitrogen, Blood (BUN): 51 mg/dL — ABNORMAL HIGH (ref 7–27)

## 2022-05-03 ENCOUNTER — Telehealth: Payer: Self-pay | Admitting: Internal Medicine

## 2022-05-03 ENCOUNTER — Encounter: Payer: Self-pay | Admitting: Internal Medicine

## 2022-05-03 DIAGNOSIS — N186 End stage renal disease: Secondary | ICD-10-CM

## 2022-05-03 NOTE — Telephone Encounter (Signed)
Kidney Transplant Referral Received From  Dialysis[]  Nephrologist[x]  Self[]     [facility][x]  Internal[]       Referral indicates living donor: Yes[]  No[x]       Insurance Information Included With Referral  Primary insurance: [employer]  ID: Ivar Bury  Insurance card included: Yes[]  No[x]   Name on card:     Secondary insurance: Yes[]  No[x]   Insurance:   ID:   Insurance card included: Yes[]  No[]   Name on card:       Provider Comments Included With Referral  Yes[x]  No[]   "Pt completed treatment for latent TB."

## 2022-05-03 NOTE — Telephone Encounter (Signed)
The following information has been obtained directly from the patient    Demographics    Legal name: Abigail Baxter   Mailing address  [address]   [place] [address]  Phone numbers  Home: [phone]  Mobile: [phone]    SSN: [id number]  Email: [email or web address]  Race: African American   Ethnicity:   Marital status: married  Employment status: retired  If working, name of employer:     Emergency contact    [place]    Name: Cathlyn Parsons  Phone #: [phone]  Type: Cell  Relation to patient: Sister  Notify person if admitted to hospital?: Yes    Name: Anette Guarneri  Phone #: [phone]  Type: Home  Relation to patient: Aunt  Notify person if admitted to hospital?: Yes    Name: Luther Parody  Phone #: [phone]  Type: Cell  Relation to patient: Sister in law  Notify person if admitted to hospital?: Yes    Is the patient on dialysis: yes  If yes,  Location:   Method:   Days & start time:       [facility]    PCP  Name: Veatrice Bourbon of identifying information (If provider not already in patient's chart)  Clinic name, phone number, address, etc:     Nephrologist  Name: Lorelee Cover, DO  Piece of identifying information (If provider not already in patient's chart)  Clinic name, phone number, address, etc:   [facility] [phone]  Fax [phone]      Insurance    Primary Insurer: Ivar Bury  ID: [id number]  Exact name on insurance card:  [place]    Secondary Insurer: Medicare  ID: [id number]  Exact name on insurance card: [place]    If the subscriber is not the patient  Subsciber name:   Subscriber relation to patient:   Subscriber DOB:   Subscriber SS:   Subscriber employer:   Employment Status:       I received verbal approval from this patient to send the transplant education video link to the email address provided by the patient and the email will be encrypted. Patient is aware that ongoing communication regarding status in the transplant program and/or medical  inquiries must be established through our on-line portal, [facility].

## 2022-05-22 NOTE — Telephone Encounter (Signed)
Patient called to let us know that she has discussed it with family and decided that she will not continue at this time.

## 2022-06-01 ENCOUNTER — Ambulatory Visit: Payer: 59
